# Patient Record
Sex: Female | Born: 1945
Health system: Southern US, Community
[De-identification: ages and names within clinical notes are randomized; demographics above are authoritative.]

## PROBLEM LIST (undated history)

## (undated) DIAGNOSIS — Z853 Personal history of malignant neoplasm of breast: Secondary | ICD-10-CM

## (undated) DIAGNOSIS — R7303 Prediabetes: Secondary | ICD-10-CM

## (undated) HISTORY — DX: Prediabetes: R73.03

## (undated) HISTORY — PX: BREAST SURGERY: SHX581

## (undated) HISTORY — DX: Personal history of malignant neoplasm of breast: Z85.3

---

## 1999-07-08 ENCOUNTER — Encounter: Admission: RE | Admit: 1999-07-08 | Discharge: 1999-07-08 | Payer: Self-pay | Admitting: Family Medicine

## 1999-07-08 ENCOUNTER — Encounter: Payer: Self-pay | Admitting: Family Medicine

## 2000-07-19 ENCOUNTER — Encounter: Admission: RE | Admit: 2000-07-19 | Discharge: 2000-07-19 | Payer: Self-pay | Admitting: Family Medicine

## 2000-07-19 ENCOUNTER — Encounter: Payer: Self-pay | Admitting: Family Medicine

## 2000-08-10 ENCOUNTER — Encounter: Admission: RE | Admit: 2000-08-10 | Discharge: 2000-08-10 | Payer: Self-pay | Admitting: Family Medicine

## 2000-08-10 ENCOUNTER — Encounter (INDEPENDENT_AMBULATORY_CARE_PROVIDER_SITE_OTHER): Payer: Self-pay

## 2000-08-10 ENCOUNTER — Encounter: Payer: Self-pay | Admitting: Family Medicine

## 2000-08-10 ENCOUNTER — Other Ambulatory Visit: Admission: RE | Admit: 2000-08-10 | Discharge: 2000-08-10 | Payer: Self-pay | Admitting: Family Medicine

## 2000-08-30 ENCOUNTER — Encounter: Payer: Self-pay | Admitting: General Surgery

## 2000-08-30 ENCOUNTER — Encounter: Admission: RE | Admit: 2000-08-30 | Discharge: 2000-08-30 | Payer: Self-pay | Admitting: General Surgery

## 2000-09-11 ENCOUNTER — Ambulatory Visit (HOSPITAL_BASED_OUTPATIENT_CLINIC_OR_DEPARTMENT_OTHER): Admission: RE | Admit: 2000-09-11 | Discharge: 2000-09-12 | Payer: Self-pay | Admitting: General Surgery

## 2000-09-11 ENCOUNTER — Encounter (INDEPENDENT_AMBULATORY_CARE_PROVIDER_SITE_OTHER): Payer: Self-pay | Admitting: *Deleted

## 2000-09-11 ENCOUNTER — Encounter: Payer: Self-pay | Admitting: General Surgery

## 2000-09-20 ENCOUNTER — Encounter: Admission: RE | Admit: 2000-09-20 | Discharge: 2000-12-19 | Payer: Self-pay | Admitting: Radiation Oncology

## 2000-10-06 ENCOUNTER — Encounter: Payer: Self-pay | Admitting: Oncology

## 2000-10-06 ENCOUNTER — Ambulatory Visit (HOSPITAL_COMMUNITY): Admission: RE | Admit: 2000-10-06 | Discharge: 2000-10-06 | Payer: Self-pay | Admitting: Oncology

## 2000-10-09 ENCOUNTER — Ambulatory Visit (HOSPITAL_COMMUNITY): Admission: RE | Admit: 2000-10-09 | Discharge: 2000-10-09 | Payer: Self-pay | Admitting: Oncology

## 2000-10-09 ENCOUNTER — Encounter: Payer: Self-pay | Admitting: Oncology

## 2000-12-30 ENCOUNTER — Encounter: Admission: RE | Admit: 2000-12-30 | Discharge: 2001-03-30 | Payer: Self-pay | Admitting: Radiation Oncology

## 2001-01-11 ENCOUNTER — Encounter: Admission: RE | Admit: 2001-01-11 | Discharge: 2001-01-11 | Payer: Self-pay | Admitting: Radiation Oncology

## 2001-07-23 ENCOUNTER — Encounter: Payer: Self-pay | Admitting: General Surgery

## 2001-07-23 ENCOUNTER — Encounter: Admission: RE | Admit: 2001-07-23 | Discharge: 2001-07-23 | Payer: Self-pay | Admitting: General Surgery

## 2001-10-16 ENCOUNTER — Encounter: Payer: Self-pay | Admitting: Oncology

## 2001-10-16 ENCOUNTER — Encounter: Admission: RE | Admit: 2001-10-16 | Discharge: 2001-10-16 | Payer: Self-pay | Admitting: Oncology

## 2002-07-24 ENCOUNTER — Encounter: Admission: RE | Admit: 2002-07-24 | Discharge: 2002-07-24 | Payer: Self-pay | Admitting: Oncology

## 2002-07-24 ENCOUNTER — Encounter: Payer: Self-pay | Admitting: Oncology

## 2003-02-20 ENCOUNTER — Encounter: Admission: RE | Admit: 2003-02-20 | Discharge: 2003-02-20 | Payer: Self-pay | Admitting: Family Medicine

## 2003-02-20 ENCOUNTER — Encounter: Payer: Self-pay | Admitting: Family Medicine

## 2003-02-21 ENCOUNTER — Encounter: Payer: Self-pay | Admitting: Family Medicine

## 2003-02-21 ENCOUNTER — Encounter: Admission: RE | Admit: 2003-02-21 | Discharge: 2003-02-21 | Payer: Self-pay | Admitting: Family Medicine

## 2003-03-04 ENCOUNTER — Ambulatory Visit: Admission: RE | Admit: 2003-03-04 | Discharge: 2003-03-04 | Payer: Self-pay | Admitting: Gynecology

## 2003-03-07 ENCOUNTER — Encounter: Payer: Self-pay | Admitting: Gynecology

## 2003-03-11 ENCOUNTER — Encounter (INDEPENDENT_AMBULATORY_CARE_PROVIDER_SITE_OTHER): Payer: Self-pay

## 2003-03-11 ENCOUNTER — Inpatient Hospital Stay (HOSPITAL_COMMUNITY): Admission: RE | Admit: 2003-03-11 | Discharge: 2003-03-13 | Payer: Self-pay | Admitting: Gynecology

## 2003-03-19 ENCOUNTER — Ambulatory Visit: Admission: RE | Admit: 2003-03-19 | Discharge: 2003-03-19 | Payer: Self-pay | Admitting: Gynecology

## 2003-08-04 ENCOUNTER — Encounter: Admission: RE | Admit: 2003-08-04 | Discharge: 2003-08-04 | Payer: Self-pay | Admitting: Oncology

## 2004-09-16 ENCOUNTER — Encounter: Admission: RE | Admit: 2004-09-16 | Discharge: 2004-09-16 | Payer: Self-pay | Admitting: Oncology

## 2004-11-16 ENCOUNTER — Ambulatory Visit: Payer: Self-pay | Admitting: Oncology

## 2005-05-20 ENCOUNTER — Ambulatory Visit: Payer: Self-pay | Admitting: Oncology

## 2005-09-19 ENCOUNTER — Encounter: Admission: RE | Admit: 2005-09-19 | Discharge: 2005-09-19 | Payer: Self-pay | Admitting: Oncology

## 2006-02-02 ENCOUNTER — Ambulatory Visit: Payer: Self-pay | Admitting: Oncology

## 2006-02-07 LAB — CBC WITH DIFFERENTIAL/PLATELET
BASO%: 0.5 % (ref 0.0–2.0)
HCT: 43.4 % (ref 34.8–46.6)
LYMPH%: 35.1 % (ref 14.0–48.0)
MCH: 30.3 pg (ref 26.0–34.0)
MCHC: 34.6 g/dL (ref 32.0–36.0)
MONO#: 0.3 10*3/uL (ref 0.1–0.9)
NEUT%: 53.6 % (ref 39.6–76.8)
Platelets: 235 10*3/uL (ref 145–400)
WBC: 4.1 10*3/uL (ref 3.9–10.0)

## 2006-02-07 LAB — COMPREHENSIVE METABOLIC PANEL
ALT: 10 U/L (ref 0–40)
CO2: 27 mEq/L (ref 19–32)
Creatinine, Ser: 0.78 mg/dL (ref 0.40–1.20)
Total Bilirubin: 0.4 mg/dL (ref 0.3–1.2)

## 2006-02-07 LAB — CANCER ANTIGEN 27.29: CA 27.29: <4 U/mL (ref 0–39)

## 2006-12-04 ENCOUNTER — Other Ambulatory Visit: Admission: RE | Admit: 2006-12-04 | Discharge: 2006-12-04 | Payer: Self-pay | Admitting: Family Medicine

## 2006-12-25 ENCOUNTER — Encounter: Admission: RE | Admit: 2006-12-25 | Discharge: 2006-12-25 | Payer: Self-pay | Admitting: Family Medicine

## 2007-10-24 ENCOUNTER — Emergency Department (HOSPITAL_COMMUNITY): Admission: EM | Admit: 2007-10-24 | Discharge: 2007-10-24 | Payer: Self-pay | Admitting: Emergency Medicine

## 2007-10-27 ENCOUNTER — Emergency Department (HOSPITAL_COMMUNITY): Admission: EM | Admit: 2007-10-27 | Discharge: 2007-10-27 | Payer: Self-pay | Admitting: Emergency Medicine

## 2008-02-04 ENCOUNTER — Inpatient Hospital Stay (HOSPITAL_COMMUNITY): Admission: RE | Admit: 2008-02-04 | Discharge: 2008-02-07 | Payer: Self-pay | Admitting: *Deleted

## 2008-02-04 ENCOUNTER — Ambulatory Visit: Payer: Self-pay | Admitting: *Deleted

## 2008-02-04 ENCOUNTER — Emergency Department (HOSPITAL_COMMUNITY): Admission: EM | Admit: 2008-02-04 | Discharge: 2008-02-04 | Payer: Self-pay | Admitting: Emergency Medicine

## 2009-08-04 ENCOUNTER — Other Ambulatory Visit: Admission: RE | Admit: 2009-08-04 | Discharge: 2009-08-04 | Payer: Self-pay | Admitting: Family Medicine

## 2009-08-10 ENCOUNTER — Encounter: Admission: RE | Admit: 2009-08-10 | Discharge: 2009-08-10 | Payer: Self-pay | Admitting: Family Medicine

## 2011-01-11 NOTE — H&P (Signed)
NAMEDARCIA, LAMPI NO.:  [id number]   MEDICAL RECORD NO.:  [id number]          PATIENT TYPE:  IPS   LOCATION:  0506                          FACILITY:  [facility]   PHYSICIAN:  Geoffery Lyons, M.D.      DATE OF BIRTH:  Jun 22, 1946   DATE OF ADMISSION:  02/04/2008  DATE OF DISCHARGE:                       PSYCHIATRIC ADMISSION ASSESSMENT   This is a voluntary admission to the services of Dr. Dub Mikes.   IDENTIFYING INFORMATION:  This is a 65 year old divorced white female.  Apparently she overdosed on an old prescription of Ambien because she  could not sleep and does not remember how much she took.  She then  stated, I may have taken 15.  There is a total of 20 missing from the  bottle.  She states that she was not sure if she wanted to kill herself  but decided that this morning she wanted to sleep more and escape.  The patient's employment called the patient's daughter because she had  not shown up for work.  When the daughter checked on her mother, the  patient was groggy and confused.  The patient states that several months  ago, she sought medication from Dr. Donell Beers.  She states that in the  last 2-3 years, she has had suicidal ideation on and off.  As it had  increased, she saw Dr. Donell Beers.  She started on Effexor, and then she  started developing paresthesias.  In fact, she was seen in the emergency  department at [facility] on February 28 for this, and she is under the care of  a neurologist, Dr. Thad Ranger.  So far, her workup is negative.  Specifically, her MRI of her head was negative.  There is no indication  at all that she has MS.   Since the paresthesias continued despite stopping the Effexor, and her  depression was increasing, she went back to Dr. Donell Beers, who cleared her  to restart the Effexor, and then she went and saw Dr. Thad Ranger, and they  bought agreed that she could restart the Effexor as well.  She has only  been back on the Effexor for approximately a  week.  She states that  since 2002, she has had many, many psychosocial stressors.  She has not  developed support or friends here like she has in other places.  She  feels that this is what happened, plus her frustration over a lack of a  diagnosis over her paresthesias.   PAST PSYCHIATRIC HISTORY:  She has never been an inpatient.  She has  started since the first of the year on an outpatient basis with Dr.  Donell Beers.  She is seeing a therapist, Hattie Perch.   SOCIAL HISTORY:  She has her Bachelor's in Social Work.  She has been  married and divorced twice.  She has a son age 104, a daughter 62.  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   FAMILY HISTORY:  Apparently, her mother suffered a  nervous breakdown  after her husband left when the patient was a child, and her maternal  grandfather suicided.  He had one leg partially amputated and was facing  the amputation of the other leg, at which point, he killed himself.   ALCOHOL/DRUG HISTORY:  She denies.   PRIMARY CARE Joya Willmott:  Wadie Lessen, Lupe Carney.   MEDICAL PROBLEMS:  She is status post left breast cancer.  She was  treated with lumpectomy, chemo and rad/rec seven years ago.   MEDICATIONS:  She is currently prescribed Effexor 75 mg p.o. daily and  Ambien 10 mg nightly.   DRUG ALLERGIES:  She has no known drug allergies.   PHYSICAL EXAMINATION:  She was medically cleared in the ED at [facility].  Vital signs on admission to the unit show that she is 5 feet 9, weighs  172.  Temperature 96.9, blood pressure 126/76, pulse 77-87, respirations  are 16.  She had a hysterectomy five years ago.   Her labs show no abnormalities of CBC.  Her UA was negative.  Her UDS,  interestingly enough, was negative for benzodiazepines.  She had no  other worrisome lab abnormalities.   MENTAL STATUS EXAM:  Tonight she is  alert and oriented.  She is  appropriately groomed, dressed, and nourished.  She is walking unaided.  Her motor exam is normal.  Her speech is normal rate, rhythm, and tone.  Her mood is appropriate to the situation.  Her affect has a normal  range.  Her thought processes are somewhat clear, rational, and goal-  oriented.  Judgment and insight are fair.  Concentration and memory are  intact.  Intelligence is at least average.  She denies being suicidal or  homicidal.  She denies any auditory or visual hallucinations.  She  states that she does feel depressed.  She has no energy.  If she uses  the Ambien, she gets six hours of sleep.  Her appetite has been somewhat  decreased the past 3-4 weeks, but her weight is steady.  She has only  had a 2-3 pound weight difference.   DIAGNOSES:   AXIS I:  Major depressive disorder, severe, without psychotic features.   AXIS II:  Deferred.   AXIS III:  Status post left breast carcinoma seven years ago.  Recently,  she has had paresthesias, for which no diagnosis has been able to be  made yet.   AXIS IV:  Severe, situational stressors.   AXIS V:  30.   PLAN:  Admit for safety and stabilization. We will adjust her meds as  indicated.  She stated that Dr. Thad Ranger' office called earlier today  and needs for her to get a neck and back MRI.  She was told that the  case manager would call Dr. Thad Ranger office about this, and apparently  he also suggested some Neurontin for her paresthesias and will just have  to call him and get his indications, what his plan is.   ESTIMATED LENGTH OF STAY:  4-5 days.     Mickie Leonarda Salon, P.A.-C.      Geoffery Lyons, M.D.  Electronically Signed   MD/MEDQ  D:  02/05/2008  T:  02/05/2008  Job:  [id number]

## 2011-01-14 NOTE — Discharge Summary (Signed)
   NAME:  Gina Jacobs, Gina Jacobs                         ACCOUNT NO.:  [id number]   MEDICAL RECORD NO.:  [id number]                   PATIENT TYPE:  INP   LOCATION:  0445                                 FACILITY:  [facility]   PHYSICIAN:  Rudy Jew. Ashley Royalty, M.D.             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  Permanent pathology pending.   OPERATIONS AND SPECIAL PROCEDURES:  Total abdominal hysterectomy, bilateral  salpingo-oophorectomy, appendectomy.   CONSULTATIONS:  None.   DISCHARGE MEDICATIONS:  Percocet.   HISTORY AND PHYSICAL:  This is a 65 year old white female seen recently by  me as well as Dr. De Blanch for a pelvic abdominal mass. She is  admitted for management of the same. For the remainder of the history and  physical please see the chart.   HOSPITAL COURSE:  The patient was admitted to [facility]  Preadmission laboratory studies were drawn. On March 11, 2003, she was taken  to the operating room and underwent total abdominal hysterectomy/bilateral  salpingo-oophorectomy. Frozen section revealed mucinous cystadenoma of the  left ovary. The procedure was uncomplicated. The patient's postoperative  course was completely benign. On the second postoperative day, she had  experienced a bowel movement. She stated a desire to return home.  Approximately one-half of the staples were removed and Benzoin and Steri-  Strips applied. The patient was felt stable for discharge and was discharged  home in satisfactory condition.   ACCESSORY CLINICAL FINDINGS:  Hemoglobin and hematocrit on admission were  14.5 and 42, respectively. White blood count was 3500. Repeat values were  obtained March 12, 2003, for 11.7, 33.6, and 5900 respectively. Type and Rh  revealed A-negative blood.  Chest x-ray revealed minimal atelectatic change  at the left base.   DISPOSITION:  The patient was to return to [facility]  Obstetric[facility] on March 17, 2003, for completion of clip removal. She is also to  return in four to six weeks for postoperative assessment. Full discharge  instructions were given.                                               James A. Ashley Royalty, M.D.    JAM/MEDQ  D:  03/13/2003  T:  03/14/2003  Job:  [id number]

## 2011-01-14 NOTE — Consult Note (Signed)
   NAME:  Gina Jacobs, Gina Jacobs NO.:  [id number]   MEDICAL RECORD NO.:  [id number]                   PATIENT TYPE:  OUT   LOCATION:  GYN                                  FACILITY:  [facility]   PHYSICIAN:  De Blanch, M.D.         DATE OF BIRTH:  02/22/1946   DATE OF CONSULTATION:  03/19/2003  DATE OF DISCHARGE:                                   CONSULTATION   REASON FOR CONSULTATION:  A 65 year old white female returns having  undergone a total abdominal hysterectomy, bilateral salpingo-oophorectomy,  and appendectomy for a left ovarian tumor on March 11, 2003.  She had an  uncomplicated postoperative course and reports that she is doing quite  nicely at the present time.   Final pathology showed an ovarian mucinous cyst adenoma with focal areas of  borderline change.  Uterus, right tube and ovary, and appendix were normal.  Because of intraoperative report that the patient had a benign mucinous  tumor, peritoneal cytology was discarded.  There was no gross evidence of  any metastatic disease at the time of exploration.   I had a lengthy discussion with the patient and her family regarding the  natural history of borderline tumors.  Given that this is confined in ovary,  I do not believe any additional therapy would be of benefit, and the  patient's prognosis should be very good.  We do recommend that she be  followed by Dr. Ashley Royalty with annual exams.  It is noted that her CA-125  value preoperatively was normal, therefore, I do not believe there would be  any benefit in obtaining followup CA-125s.  All of the patient's questions  are answered, and she will return to Dr. Ashley Royalty for a six-week follow-up  visit and annual exams.                                               De Blanch, M.D.    DC/MEDQ  D:  03/19/2003  T:  03/19/2003  Job:  [id number]   cc:   Fayrene Fearing A. Ashley Royalty, M.D.  [address]  [place], [place] [address]  Fax: [phone]   Telford Nab, R.N.  [address]  [place], [place] [address]

## 2011-01-14 NOTE — Discharge Summary (Signed)
NAMESHERRIN, STAHLE NO.:  [id number]   MEDICAL RECORD NO.:  [id number]          PATIENT TYPE:  IPS   LOCATION:  0504                          [facility]   PHYSICIAN:  Geoffery Lyons, M.D.      DATE OF BIRTH:  1946-02-15   DATE OF ADMISSION:  02/04/2008  DATE OF DISCHARGE:  02/07/2008                               DISCHARGE SUMMARY   CHIEF COMPLAINT/PRESENTING ILLNESS:  This was the first admission to  [facility] for this 65 year old divorced white female.  She overdosed on an old prescription of Ambien.  She could not sleep.  Could not remember how much she took. She then stated she says they may  have taken 15.  There were a total of 20, missing from the bottle. She  was not sure if she wanted to kill herself, but decided that she wanted  to sleep more and escape.  The employer called the patient's daughter  because she had not shown up for work, when the daughter checked her  mother, she was groggy and confused. Several months prior to this  admission she sought medications from Dr. Donell Beers.  In the last two or  three years, she had had suicidal ideas on and off. She was started on  Effexor and started developing paresthesias. She was seen in the ED at  [facility].  She is under the care of Dr. Thad Ranger, a neurologist.  The  paresthesias continued despite stopping the Effexor and her depression  was increasing. She went back to Dr. Donell Beers.  She went back on the  Effexor. Endorsed multiple psychosocial stressors. Has not developed a  support system. Endorsed she had been frustrated with the lack of a  diagnoses over her paresthesia.   PAST PSYCHIATRIC HISTORY:  Never inpatient. She has seen Dr. Donell Beers and  sees a therapist Hattie Perch.   SECONDARY HISTORY:  No active use of any substances.   MEDICAL HISTORY:  Status post left breast cancer.   MEDICATION:  1. Effexor XR 75 mg per day.  2. Ambien 10 at bedtime.   PHYSICAL EXAMINATION:  Exam  failed to show any acute findings.   LABORATORY WORK:  CBC within normal limits.  UDS negative for substances  of abuse said that this time reveals alert cooperative female  appropriately groomed, dressed and nourished. Speech was normal in rate,  tempo and production.  Mood was anxious.  Affect was broad.  Thought  processes were logical, coherent and relevant.  There were no active  suicidal ideations.  No evidence of delusions.  No hallucinations.  Cognition well-preserved.   ADMITTING DIAGNOSES:  AXIS I: Major depressive disorder.  AXIS II: No diagnosis.  AXIS III:  Status post left breast cancer, paresthesia.  AXIS IV: Moderate.  AXIS V:  Upon admission 30, 35, highest GAF in the last year 65.   COURSE IN THE HOSPITAL:  She was admitted, started individual and group  psychotherapy and we maintained the Effexor.  She was placed back on the  Ambien. As already stated, 65 year old female, divorced, with two  children  who works in the Animal nutritionist. Used to work at [employer]. Left due to issues with another staff member. She left and  start working in the [employer] and has been there for the last 4  years.  Start experiencing increased pressures and demands from work.  She has been battling the depression for the last several years. Got to  a point she could not function anymore.  She was started on Effexor.  She had been using it successfully in the past.  She is still having the  paresthesia, some burning sensation. It was discontinued (Effexor was  discontinued),  but since the symptoms continue and her depression got  worse, she went back on the Effexor. She sees Drusilla Kanner  and had been successfully using Effexor in the past. She also endorsed  other than having the breast cancer, mother having a stroke and being  involved in a failed relationship.  There was a family session with her  daughter that went well.  She was very concerned about  communicating  with the daughter. Felt very encouraged and very supported. June 11, she  was in full contact with reality.  There was some marked change from  admission.  Her mood was better and her affect was bright.  Endorsed  that she learned a lot being on the other side as she worked in the  Animal nutritionist.  Did work on self on her coping.  She was able to  get to feel back to the way she was feeling before she started dealing  with this depression. Encouraged, motivated, no homicidal or suicidal  ideation.  We went ahead and discharged to outpatient follow-up.   DISCHARGE DIAGNOSES:  AXIS I: Major depressive disorder.  AXIS II: No diagnosis.  AXIS III:  No diagnosis.  AXIS IV: Moderate.  AXIS V:  Upon discharge 60.   Discharged on Effexor XR 35 mg per day, Ambien CR 12.5 and at bedtime as  needed for sleep.  Follow-up Dr. Donell Beers and Belva Bertin, M.D.  Electronically Signed     IL/MEDQ  D:  03/05/2008  T:  03/05/2008  Job:  [id number]

## 2011-01-14 NOTE — Consult Note (Signed)
NAME:  Gina Jacobs, Gina Jacobs NO.:  [id number]   MEDICAL RECORD NO.:  [id number]                   PATIENT TYPE:  OUT   LOCATION:  GYN                                  FACILITY:  [facility]   PHYSICIAN:  De Blanch, M.D.         DATE OF BIRTH:  Aug 08, 1946   DATE OF CONSULTATION:  03/04/2003  DATE OF DISCHARGE:                                   CONSULTATION   HISTORY OF PRESENT ILLNESS:  A 65 year old white female seen in consultation  at the request of Ronda Fairly. Tuso, M.D. regarding a newly diagnosed  abdominal mass.   The patient notes that approximately two months ago her abdominal contour  became enlarged.  She denies any pain or pressure, any other GI or GU  symptoms.  She was seen by Ronda Fairly. Tuso, M.D. who discovered a 20 cm  abdominopelvic mass.  Evaluation to date has included a CT scan and a CA-  125.  The CA-125 was 23.  CT scan shows a multiloculated pelvic cyst arising  from the midline extending into the lower abdomen.  There is a slight amount  of hydronephrosis, but no other adenopathy, ascites, or any evidence of  metastatic disease.   PAST MEDICAL HISTORY:  Breast cancer December 2001.  The patient was treated  with lumpectomy, lymph node dissection, and chemotherapy followed by  radiation therapy.  The patient has been followed subsequently by Raymond Gurney C.  Magrinat, M.D. and remains free of disease.  She has no other medical  illnesses.   PAST SURGICAL HISTORY:  1. Breast surgery noted above.  2. Bilateral tubal ligation.   ALLERGIES:  None.   CURRENT MEDICATIONS:  Arimidex.   PAST OBSTETRICAL HISTORY:  Gravida 2.   PAST GYNECOLOGICAL HISTORY:  The patient is menopausal since approximately  age 27.  She took hormonal replacement therapy briefly.  She has no other  gynecologic problems in the past.   FAMILY HISTORY:  Negative for gynecologic, breast, or colon cancer.   SOCIAL HISTORY:  The patient is divorced.  She  does not smoke.  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   REVIEW OF SYSTEMS:  No cardiovascular, pulmonary, GI, GU, musculoskeletal,  or neurologic symptoms.   PHYSICAL EXAMINATION:  VITAL SIGNS:  Weight 162 pounds.  GENERAL:  The patient is a healthy white female in no acute distress.  HEENT:  Negative.  NECK:  Supple without thyromegaly.  LYMPH:  There is no supraclavicular or inguinal adenopathy.  ABDOMEN:  Soft, nontender.  She has a mass extending nearly to the  umbilicus.  This is nontender to palpation.  There is no evidence of ascites  or hernias.  PELVIC:  EGBUS, vagina, bladder, urethra are normal.  Cervix is parous and  normal.  Uterus is difficult to outline given the fact this large mass is  filling the entire pelvic.  Rectovaginal examination confirms.  LABORATORIES:  The patient's CT scan and laboratory data are reviewed.   IMPRESSION:  Multiloculated ovarian cyst which is quite large, most likely a  mucinous cystadenoma.   PLAN:  I would recommend the patient undergo exploratory laparotomy and  resection of the mass.  The patient desires to have uterus and other tube  and ovary removed.   We did discuss at length if this did turn out to be a cancer the extent of  surgical staging including omentectomy, peritoneal biopsies, and pelvic and  periaortic lymphadenectomy.   The patient's questions are answered.  She is aware of the risks of surgery  including hemorrhage, infection, injury to adjacent viscera, thromboembolic  complications, and anesthetic risks.  She wishes to proceed with surgery  next Tuesday.  She understands Ronda Fairly. Galen Daft, M.D. will be out of his  office for the next two weeks and that Fayrene Fearing A. Ashley Royalty, M.D. and I will be  performing her surgery.                                               De Blanch, M.D.    DC/MEDQ  D:  03/04/2003  T:  03/04/2003  Job:  [id number]   cc:   Ronda Fairly. Galen Daft, M.D.   [address], [address]  [place]  [place] [address]  Fax: [phone]   Telford Nab, R.N.  [phone]  [place], [place] [address]

## 2011-01-14 NOTE — H&P (Signed)
NAME:  Gina Jacobs, Gina Jacobs                         ACCOUNT NO.:  [id number]   MEDICAL RECORD NO.:  [id number]                   PATIENT TYPE:  INP   LOCATION:  NA                                   FACILITY:  [facility]   PHYSICIAN:  James A. Ashley Royalty, M.D.             DATE OF BIRTH:  13-Apr-1946   DATE OF ADMISSION:  03/11/2003  DATE OF DISCHARGE:                                HISTORY & PHYSICAL   HISTORY OF PRESENT ILLNESS:  The patient is a 65 year old G3, P2, AB1  referred through the courtesy of Mckinley Jewel, M.D. for evaluation and  management of a pelviabdominal mass.  The patient was originally seen in the  office of Dr. Lupe Carney by Alcide Clever.  Pelvic examination revealed  a large mass.  Pap was done in the office and reportedly was negative.  The  patient was then sent to Dr. Galen Daft who concurred regarding the presence of a  pelviabdominal mass.  Due to the fact that Dr. Galen Daft is unavailable for much  of the month he referred the patient to me in order to maintain continuity  of care and expeditious disposition of her problem. An ultrasound was  performed a [facility] February 20, 2003 which revealed a  complex mass occupying a large portion of the pelvis to the lower abdomen  that is suspicious for ovarian carcinoma.  In addition a CT scan was  performed February 21, 2003 at [facility] which revealed a 20  X 18 X 13 cm pelviabdominal mass with no associated peritoneal metastatic  disease, adenopathy or free pelvic fluid.  The differential diagnosis  included benign or malignant neoplasm.  The patient is hence for exploratory  laparotomy with Dr. De Blanch.   MEDICATIONS:  Arimidex, Ambien.   PAST MEDICAL HISTORY:  Medical history is negative.   PAST SURGICAL HISTORY:  Patient is status post left lumpectomy for breast  carcinoma.  She also had chemotherapy and radiation.   ALLERGIES:  None.   FAMILY HISTORY:  Positive for  diabetes.   SOCIAL HISTORY:  Patient denies use of tobacco or significant alcohol.   REVIEW OF SYMPTOMS:  Noncontributory.   PHYSICAL EXAMINATION:  GENERAL:  Well-developed, well-nourished, pleasant  white female in no acute distress, afebrile.  VITAL SIGNS:  Stable.  SKIN:  Warm and dry without lesions.  LYMPH:  There is no cervical, supraclavicular or inguinal lymphadenopathy.  HEENT:  Normocephalic.  NECK:  Supple without thyromegaly.  CHEST:  Lungs are clear.  CARDIAC:  Regular rate and rhythm without murmurs, gallops or rubs.  BREASTS: Bilateral without dominant mass, discharge, retraction or  adenopathy.  There is a well healed surgical scar on the superior aspect of  the left breast.  ABDOMEN:  Examination reveals an obvious mass which comes up to  approximately the level of the umbilicus. Bowel sounds are active.  MUSCULOSKELETAL:  Examination reveals full range  of motion without edema,  cyanosis or costovertebral angle tenderness.  PELVIC:  On examination the external genitalia is atrophic.  Urethra and  meatus are normal.  Vagina and cervix are without gross lesions.  Pap smear  is deferred.  Bimanual examination reveals large pelviabdominal mass.  The  superior aspect of the mass is noted to reach the umbilicus.   IMPRESSION:  1. Pelviabdominal mass, etiology uncertain.  Differential includes benign     versus malignant ovarian neoplasm.  2. History of breast carcinoma 2.5 years ago treated with lumpectomy,     chemotherapy and radiation therapy.   PLAN:  Exploratory laparotomy with total abdominal hysterectomy and  bilateral salpingo-oophorectomy and indicated procedures.  The patient  understands the possibility of carcinoma and in that circumstance that she  would be a candidate for pelvic and periaortic lymphadenectomy and agrees.  The risks, benefits, complications and alternatives were full discussed with  the patient.  She states she understands and accepts.   Questions are invited  and answered.                                               James A. Ashley Royalty, M.D.    JAM/MEDQ  D:  03/10/2003  T:  03/10/2003  Job:  [id number]

## 2011-01-14 NOTE — Consult Note (Signed)
Gina Jacobs, Gina Jacobs NO.:  [id number]   MEDICAL RECORD NO.:  [id number]          PATIENT TYPE:  EMS   LOCATION:  ED                           FACILITY:  [facility]   PHYSICIAN:  Darnelle Bos, MDDATE OF BIRTH:  [birth year]   DATE OF CONSULTATION:  DATE OF DISCHARGE:  10/27/2007                                 CONSULTATION   REQUESTING PHYSICIAN:  [facility].   REASON FOR CONSULTATION:  Paresthesias.   Ms. Reach is a 65 year old, right handed, Caucasian female who has been  having episodes of paresthesias for a week.  The patient describes these  episodes which first started on Saturday as both feet getting numb which  spread up to the knees and also hands up to the elbows and her face,  tongue, and scalp getting numb and tingling.  Eventually, it effected  her breathing which became shallow.  This episode lasted for a few  hours.  It seems she had another episode which appeared worse on  Wednesday and another episode on Saturday morning.  She also thinks that  during the these episodes, she has to think before she talks and needs  to talk slowly and walk slowly due to the numbness.  She denies any headache.  She denies any dizziness.  She denies any  weakness per se.  She denies any bowel or bladder problems.  She denies  any chest pain or palpitations.  She does think that after the episode  starts she starts breathing harder and hyperventilates.  She thinks that  the episodes have slowly been intensifying.  She was recently started on Effexor and Ambien on February 13th for  major depression.  Initially it was thought that they were panic  attacks.  She spoke to her psychiatrist and they do not think that these  episodes are suggestive of panic attacks.  She also reports some recent  sinus infection.   PAST MEDICAL HISTORY:  1. Major depression.  2. History of breast cancer, status post lumpectomy with chemo and      radiation 7 years  ago.   SOCIAL HISTORY:  The patient denies any smoking or alcohol use.  She  does not work.   MEDICATIONS:  Effexor and Ambien CR.   ALLERGIES:  None.   REVIEW OF SYSTEMS:  All systems are reviewed and everything was negative  other than that mentioned in the history and physical.  She has not  noticed any change in her appetite or weight.   PHYSICAL EXAMINATION:  VITAL SIGNS:  Heart rate 66, blood pressure  138/70, respirations 20, temperature 97.4.  GENERAL:  Ms. Patchen is a pleasant female in no acute distress.  HEENT:  Normocephalic atraumatic.  NECK:  Supple.  No carotid bruits.  CARDIOVASCULAR:  Regular rhythm and rate.  LUNGS:  Clear  EXTREMITIES:  No cyanosis, clubbing, or edema.  NEUROLOGIC:  The patient is awake, alert, oriented.  Her mental function  was within normal limits.  Cranial nerves were intact.  Pupils were  symmetric and reactive to light.  No facial asymmetry.  Tongue was  midline without any weakness.  Palate elevation was symmetric.  Facial  sensation was normal to pinprick, touch, and temperature.  Motor  evaluation normal bulk and  tone.  Deep tendon reflexes were 2 plus and  symmetric in the upper extremities, 1 plus and symmetric in the lower  extremities.  Plantars were downgoing.  Sensation was intact to pinprick  other than some areas of decreased pinprick on the right leg and the arm  that do not qualify to be in any dermatomal distribution and was patchy.  She also has decreased temperature on the left side which was patchy  without any dermatomal distribution.  Normal vibration.  Normal position  .  Normal finger-to-nose testing and knee heel testing.  Normal truncal  balance.   She had a D-dimer today that was 5.53.  BMP:  Sodium 141, potassium 4,  chloride 104, bicarb 31, glucose 100, BUN 11, calcium 9.2.  Cardiac  markers showed a CK-MB of less than 1, troponin of less than 0.05,  myoglobin 44.9.  CBC showed hemoglobin of 15.7, hematocrit  44.8, WBC  8.5, platelets 265.  She had a CT head today that showed no acute  intracranial findings and stable when compared to prior exam.   IMPRESSION:  Bilateral arm and leg paresthesias which are transient.  This is unlikely to be a vascular event but she does note that she might  have some problem on the left more than the right side.  Since the  patient's exam has had these recurrent symptoms and some of them have  been associated with respiratory alkalosis, it is unclear if this may be  secondary to respiratory alkalosis.   Due to her risk factors of hypertension, we do recommend an outpatient  MRI of the brain with MRA and followup with a neurologist.  We will also consider stopping the Effexor as sometimes that may cause  some paresthesias.  Her exam is not consistent with an AIDP  or Guillain-Barre syndrome as she does have intact reflexes and her  examination is normal.   The patient was advised to seek immediate medical attention if she has  another episode of worsening of her symptoms.  The patient expresses  satisfaction and appears not to have any questions at this time.      Darnelle Bos, MD  Electronically Signed     RB/MEDQ  D:  10/28/2007  T:  10/28/2007  Job:  [id number]

## 2011-01-14 NOTE — Op Note (Signed)
NAME:  Gina Jacobs, Gina Jacobs                         ACCOUNT NO.:  [id number]   MEDICAL RECORD NO.:  [id number]                   PATIENT TYPE:  INP   LOCATION:  Y782                                 [facility]   PHYSICIAN:  De Blanch, M.D.         DATE OF BIRTH:  [birth year]   DATE OF PROCEDURE:  03/11/2003  DATE OF DISCHARGE:                                 OPERATIVE REPORT   PREOPERATIVE DIAGNOSIS:  Complex pelvic mass.   POSTOPERATIVE DIAGNOSIS:  Mucinous cystadenoma of the left ovary.   PROCEDURE:  Total abdominal hysterectomy, bilateral salpingo-oophorectomy  and appendectomy.   CO-SURGEONS:  De Blanch, M.D. and Rudy Jew. Ashley Royalty, M.D.   ASSISTANT:  Telford Nab, R.N.   ANESTHESIA:  General with oral tracheal tube.   ESTIMATED BLOOD LOSS:  50 mL   SURGICAL FINDINGS:  At the time of exploratory laparotomy, the upper abdomen  including the diaphragm, liver, spleen, stomach, omentum, small and large  bowel were normal. There is an 18 cm multicystic mass arising from the left  ovary which was smooth, had no excrescences and was not adherent to any  other structures. The frozen section returned showing a mucinous  cystadenoma. Because of the mucinous component, an appendectomy was  indicated. The right tube and ovary and uterus appeared normal.   DESCRIPTION OF PROCEDURE:  The patient was brought to the operating room and  after satisfactory attainment of general anesthesia was placed in the  modified lithotomy position in [place] stirrups. The anterior abdominal wall,  perineum and vagina were prepped with Betadine, Foley catheter was placed,  and the patient was draped. The abdomen was entered through a left  paramedian incision. Peritoneal washings were obtained from the pelvis. The  upper abdomen and pelvis were explored with the above noted findings. The  left retroperitoneal space was opened identifying the ureter. The ovarian  vessels were  skeletonized and clamped. At this juncture, the patient  developed a severe bradycardia necessitating application of atropine. The  procedure was halted at this juncture until her heart rate returned to  normal. Thereafter the ovarian vessels were divided, free tied and suture  ligated. The broad ligament was incised. The uterine ovarian anastomosis and  fallopian tube were cross clamped and divided and the left tube and ovary  handed off the operative field for frozen section. The uterine cornu were  grasped with long Kelly clamps. A Bookwalter retractor was assembled and the  small bowel and colon were packed out of the pelvis. The right round  ligament was divided, the retroperitoneal space opened, the ovarian vessels  skeletonized, clamped, cut, free tied and suture ligated. The bladder flap  was advanced with sharp and blunt dissection, the uterine vessels were  skeletonized and then clamped, cut and suture ligated in a step wise  fashion. The paracervical and cardinal ligaments were clamped, cut and  suture ligated. The rectovaginal septum was opened isolating the uterosacral  ligaments and vaginal angle. The vagina and uterosacral ligaments were cross  clamped, divided and the vagina transected from its junction with the  cervix. The uterus, cervix, right tube and ovary were handed off the  operative field. The vaginal angles were transfixed with #0 Vicryl, the  central portion of the vagina and rectovaginal septum closed with  interrupted figure-of-eight sutures of #0 Vicryl. The pelvis was inspected  and found to be hemostatic.   Because frozen section indicated this was a mucinous cystadenoma of the  ovary and because of the association with mucinous tumors of the appendix,  appendectomy was felt to be indicated. The appendiceal vessels were isolated  and the mesoappendix cross clamped including these vessels and divided. This  was suture ligated. The appendiceal base was then  cross clamped and the  appendix transected. The appendiceal stump was suture ligated with 2-0  Vicryl suture and tied before and after. A pursestring suture of 2-0 silk  was placed and the stump inverted and the pursestring suture tied. The  pelvis and the abdomen were irrigated with saline, all packs and retractors  were removed, the anterior abdominal wall was closed in layers the first  being a running mass closure using #1 PDS. The subcutaneous tissue was  irrigated, hemostasis achieved with cautery and the skin closed with skin  staples. A dressing was applied, the patient was awakened from anesthesia  and taken to the recovery room in satisfactory condition. Sponge, needle and  instrument counts were correct x2.                                               De Blanch, M.D.    DC/MEDQ  D:  03/11/2003  T:  03/11/2003  Job:  [id number]   cc:   Fayrene Fearing A. Ashley Royalty, M.D.  [address]  [place], [place] [address]  Fax: [phone]   Telford Nab, R.N.  [address]  [place], [place] [address]   L. Lupe Carney, M.D.  [address] [place]  [place] [address]  Fax: [phone]   Ronda Fairly. Galen Daft, M.D.  [address]  [place]  [place] [address]  Fax: [phone]

## 2011-01-14 NOTE — Op Note (Signed)
[place]  Patient:    ELIF, YONTS                      MRN: [id number] Proc. Date: 09/11/00 Adm. Date:  [id number] Attending:  Edmund Hilda CC:         [facility], M.D.   Operative Report  PREOPERATIVE DIAGNOSIS:  Carcinoma left breast.  POSTOPERATIVE DIAGNOSIS:  Carcinoma left breast.  PROCEDURE:  Sentinel lymph node biopsy x 2, partial mastectomy left.  SURGEON:  Timothy E. Earlene Plater, M.D.  ANESTHESIA:  General.  INDICATIONS FOR PROCEDURE:  Ms. Omura was referred to my office in middle December with a Core needle biopsy proven adenocarcinoma.  After careful consultation she wished to delay the surgery until after the holidays and then was delayed by snow, so this is the first urgent time available.  The patient is well informed and ready to proceed and agrees and understands the procedure.  PROCEDURE IN DETAIL:  She was brought to the operating room after having been at [facility] where technetium was injected beneath the left areolar complex.  She was placed supine on the operating table, general LAM anesthesia provided. Lymphazurin green dye was injected under the nipple areolar complex and into the tumor bed.  This was gently massaged.  The entire left breast and chest was prepped and draped in the usual fashion.  The tumor was palpable in the 12 oclock position extending from the 12 to the 2 oclock position approximately 8 cm outside the areolar edge.  The neoprobe was used to scan the axillary area and there appeared to be one area warmer than any other.  This was marked.  The natural folds of the axillae were marked, and then a small incision was made over the estimated hot nodal area.  Surgery was continued down until the axillary fat was entered and then careful scanning with the neoprobe revealed a single area that was quite hot.  From this area and in about the lower mid axilla was an  obvious lymph node, it was grasped and bluntly dissected from the surrounding tissue.  It indeed was hot. It was sent at specimen #1 hot blue node.  Immediately behind that was a second hot area that after dissection revealed itself to be a lymph node as well.  It was blue and hot as well, labeled and sent as specimen #2.  Careful scanning of the rest of the axilla revealed no areas of increased activity.  The axilla was packed, draped separately, and then I proceeded with a generous partial mastectomy excising the skin, and then by palpation, sharp dissection, around the palpable tumor area removed the entire tumor and surrounding breast tissue.  This was marked and sent to pathology.  This cavity was carefully inspected.  Bleeding was controlled with a cautery.  The wound was dry.  I did measure the liguid volume of the cavity and it was approximately 50 cc.  That wound was closed with 3-0 Vicryl and 4-0 Monocryl.  The counts were correct at that point.  The two lymph nodes touch preps were returned as both negative by Dr. Sherryll Burger and the axilla was closed as well with 3-0 Vicryl and 4-0 Monacryl. Steri-Strips applied.  Second counts correct.  Dr. Sherryll Burger did proceed with a frozen section for margins of the partial mastectomy specimen and they too were negative.  This completed the procedure.  The patient was  awakened and taken to the recovery room in good condition.  She will be kept overnight in recovery care center and followed as an outpatient. DD:  09/11/00 TD:  09/11/00 Job: [id number]

## 2011-05-20 LAB — BASIC METABOLIC PANEL
BUN: 11
Chloride: 104
GFR calc non Af Amer: >60
Potassium: 4
Sodium: 141

## 2011-05-20 LAB — COMPREHENSIVE METABOLIC PANEL
ALT: 12
AST: 16
Albumin: 4
CO2: 18 — ABNORMAL LOW
Chloride: 106
GFR calc Af Amer: >60
GFR calc non Af Amer: >60
Potassium: 3.6
Sodium: 138
Total Bilirubin: 0.9

## 2011-05-20 LAB — CBC
HCT: 44.8
Hemoglobin: 15.7 — ABNORMAL HIGH
MCV: 87.9
Platelets: 265
RBC: 5.13 — ABNORMAL HIGH
WBC: 7.5
WBC: 8.5

## 2011-05-20 LAB — BLOOD GAS, ARTERIAL
Acid-Base Excess: 0.7
Drawn by: 145321
FIO2: 0.21
pCO2 arterial: 16.5 — CL
pH, Arterial: 7.654
pO2, Arterial: 99

## 2011-05-20 LAB — DIFFERENTIAL
Basophils Absolute: 0.1
Eosinophils Absolute: 0
Eosinophils Absolute: 0.1
Eosinophils Relative: 0
Eosinophils Relative: 1
Lymphocytes Relative: 28
Lymphs Abs: 2
Lymphs Abs: 2.4
Monocytes Absolute: 0.5
Monocytes Absolute: 0.6
Monocytes Relative: 7

## 2011-05-20 LAB — POCT CARDIAC MARKERS
CKMB, poc: <1 — ABNORMAL LOW
Troponin i, poc: <0.05
Troponin i, poc: <0.05

## 2011-05-20 LAB — D-DIMER, QUANTITATIVE
D-Dimer, Quant: 0.51 — ABNORMAL HIGH
D-Dimer, Quant: 0.53 — ABNORMAL HIGH

## 2011-05-26 LAB — CBC
Hemoglobin: 14.8
MCHC: 34.2
MCV: 87.4
RBC: 4.96
WBC: 4.5

## 2011-05-26 LAB — RAPID URINE DRUG SCREEN, HOSP PERFORMED
Amphetamines: NOT DETECTED
Barbiturates: NOT DETECTED
Benzodiazepines: NOT DETECTED
Cocaine: NOT DETECTED
Opiates: NOT DETECTED
Tetrahydrocannabinol: NOT DETECTED

## 2011-05-26 LAB — DIFFERENTIAL
Basophils Relative: 1
Lymphocytes Relative: 44
Monocytes Absolute: 0.3
Monocytes Relative: 8
Neutro Abs: 2.2
Neutrophils Relative %: 47

## 2011-05-26 LAB — URINALYSIS, ROUTINE W REFLEX MICROSCOPIC
Bilirubin Urine: NEGATIVE
Glucose, UA: NEGATIVE
Hgb urine dipstick: NEGATIVE
Ketones, ur: NEGATIVE
Nitrite: NEGATIVE
Specific Gravity, Urine: 1.007
pH: 7.5

## 2011-05-26 LAB — COMPREHENSIVE METABOLIC PANEL
Albumin: 3.4 — ABNORMAL LOW
BUN: 4 — ABNORMAL LOW
Calcium: 8.7
Creatinine, Ser: 0.8
Glucose, Bld: 88
Total Protein: 6.5

## 2011-11-29 ENCOUNTER — Other Ambulatory Visit: Payer: Self-pay | Admitting: Family Medicine

## 2011-11-29 DIAGNOSIS — Z1231 Encounter for screening mammogram for malignant neoplasm of breast: Secondary | ICD-10-CM

## 2011-12-05 ENCOUNTER — Ambulatory Visit
Admission: RE | Admit: 2011-12-05 | Discharge: 2011-12-05 | Disposition: A | Discharge status: Home / Self Care | Payer: Medicare Other | Source: Ambulatory Visit | Attending: Family Medicine | Admitting: Family Medicine

## 2011-12-05 DIAGNOSIS — Z1231 Encounter for screening mammogram for malignant neoplasm of breast: Secondary | ICD-10-CM

## 2015-05-07 ENCOUNTER — Telehealth (HOSPITAL_COMMUNITY): Payer: Self-pay | Admitting: Psychiatry

## 2015-05-07 NOTE — Telephone Encounter (Signed)
Fax request for refill of zolpidem sent to Dr Donell Beers at this site.  No record or her having been seen here so faxed back to pharmacy suggesting reaching Dr Donell Beers at another site.

## 2015-06-22 ENCOUNTER — Other Ambulatory Visit (HOSPITAL_COMMUNITY): Payer: Self-pay | Admitting: Psychiatry

## 2019-07-23 ENCOUNTER — Other Ambulatory Visit: Payer: Self-pay | Admitting: Family Medicine

## 2019-07-23 DIAGNOSIS — R0989 Other specified symptoms and signs involving the circulatory and respiratory systems: Secondary | ICD-10-CM

## 2019-07-31 ENCOUNTER — Ambulatory Visit
Admission: RE | Admit: 2019-07-31 | Discharge: 2019-07-31 | Disposition: A | Discharge status: Home / Self Care | Payer: Medicare Other | Source: Ambulatory Visit | Attending: Family Medicine | Admitting: Family Medicine

## 2019-07-31 DIAGNOSIS — R0989 Other specified symptoms and signs involving the circulatory and respiratory systems: Secondary | ICD-10-CM

## 2021-01-18 DIAGNOSIS — R7303 Prediabetes: Secondary | ICD-10-CM | POA: Diagnosis not present

## 2021-01-18 DIAGNOSIS — E7800 Pure hypercholesterolemia, unspecified: Secondary | ICD-10-CM | POA: Diagnosis not present

## 2021-07-29 DIAGNOSIS — R7303 Prediabetes: Secondary | ICD-10-CM | POA: Diagnosis not present

## 2021-07-29 DIAGNOSIS — E538 Deficiency of other specified B group vitamins: Secondary | ICD-10-CM | POA: Diagnosis not present

## 2021-07-29 DIAGNOSIS — R0789 Other chest pain: Secondary | ICD-10-CM | POA: Diagnosis not present

## 2021-07-29 DIAGNOSIS — E7800 Pure hypercholesterolemia, unspecified: Secondary | ICD-10-CM | POA: Diagnosis not present

## 2021-07-29 DIAGNOSIS — G629 Polyneuropathy, unspecified: Secondary | ICD-10-CM | POA: Diagnosis not present

## 2021-07-29 DIAGNOSIS — Z0000 Encounter for general adult medical examination without abnormal findings: Secondary | ICD-10-CM | POA: Diagnosis not present

## 2021-07-29 DIAGNOSIS — Z1389 Encounter for screening for other disorder: Secondary | ICD-10-CM | POA: Diagnosis not present

## 2021-07-29 DIAGNOSIS — G4700 Insomnia, unspecified: Secondary | ICD-10-CM | POA: Diagnosis not present

## 2021-07-29 DIAGNOSIS — M8580 Other specified disorders of bone density and structure, unspecified site: Secondary | ICD-10-CM | POA: Diagnosis not present

## 2021-08-03 DIAGNOSIS — Z1211 Encounter for screening for malignant neoplasm of colon: Secondary | ICD-10-CM | POA: Diagnosis not present

## 2021-09-14 DIAGNOSIS — Z1231 Encounter for screening mammogram for malignant neoplasm of breast: Secondary | ICD-10-CM | POA: Diagnosis not present

## 2021-09-14 DIAGNOSIS — Z780 Asymptomatic menopausal state: Secondary | ICD-10-CM | POA: Diagnosis not present

## 2021-09-14 DIAGNOSIS — M85852 Other specified disorders of bone density and structure, left thigh: Secondary | ICD-10-CM | POA: Diagnosis not present

## 2021-09-14 DIAGNOSIS — M85851 Other specified disorders of bone density and structure, right thigh: Secondary | ICD-10-CM | POA: Diagnosis not present

## 2021-11-24 ENCOUNTER — Other Ambulatory Visit: Payer: Self-pay | Admitting: Family Medicine

## 2021-11-24 ENCOUNTER — Ambulatory Visit
Admission: RE | Admit: 2021-11-24 | Discharge: 2021-11-24 | Disposition: A | Discharge status: Home / Self Care | Payer: Medicare Other | Source: Ambulatory Visit | Attending: Family Medicine | Admitting: Family Medicine

## 2021-11-24 DIAGNOSIS — R0600 Dyspnea, unspecified: Secondary | ICD-10-CM

## 2021-11-24 DIAGNOSIS — R079 Chest pain, unspecified: Secondary | ICD-10-CM | POA: Diagnosis not present

## 2021-11-24 DIAGNOSIS — R0602 Shortness of breath: Secondary | ICD-10-CM | POA: Diagnosis not present

## 2021-11-29 ENCOUNTER — Other Ambulatory Visit: Payer: Self-pay | Admitting: Family Medicine

## 2021-11-29 DIAGNOSIS — R0600 Dyspnea, unspecified: Secondary | ICD-10-CM

## 2021-11-29 DIAGNOSIS — Z853 Personal history of malignant neoplasm of breast: Secondary | ICD-10-CM

## 2021-11-30 ENCOUNTER — Other Ambulatory Visit (HOSPITAL_COMMUNITY): Payer: Self-pay | Admitting: Radiology

## 2021-11-30 DIAGNOSIS — R0600 Dyspnea, unspecified: Secondary | ICD-10-CM

## 2021-12-13 ENCOUNTER — Ambulatory Visit (HOSPITAL_COMMUNITY)
Admission: RE | Admit: 2021-12-13 | Discharge: 2021-12-13 | Disposition: A | Discharge status: Home / Self Care | Payer: Medicare Other | Source: Ambulatory Visit | Attending: Family Medicine | Admitting: Family Medicine

## 2021-12-13 DIAGNOSIS — R0600 Dyspnea, unspecified: Secondary | ICD-10-CM | POA: Diagnosis not present

## 2021-12-13 LAB — PULMONARY FUNCTION TEST
DL/VA % pred: 105 %
DL/VA: 4.23 ml/min/mmHg/L
DLCO unc % pred: 92 %
DLCO unc: 19.63 ml/min/mmHg
FEF 25-75 Post: 2.64 L/sec
FEF 25-75 Pre: 2.44 L/sec
FEF2575-%Change-Post: 8 %
FEF2575-%Pred-Post: 143 %
FEF2575-%Pred-Pre: 132 %
FEV1-%Change-Post: 2 %
FEV1-%Pred-Post: 102 %
FEV1-%Pred-Pre: 100 %
FEV1-Post: 2.47 L
FEV1-Pre: 2.42 L
FEV1FVC-%Change-Post: 3 %
FEV1FVC-%Pred-Pre: 108 %
FEV6-%Change-Post: -1 %
FEV6-%Pred-Post: 96 %
FEV6-%Pred-Pre: 98 %
FEV6-Post: 2.95 L
FEV6-Pre: 2.99 L
FEV6FVC-%Pred-Post: 104 %
FEV6FVC-%Pred-Pre: 104 %
FVC-%Change-Post: -1 %
FVC-%Pred-Post: 92 %
FVC-%Pred-Pre: 93 %
FVC-Post: 2.95 L
FVC-Pre: 2.99 L
Post FEV1/FVC ratio: 84 %
Post FEV6/FVC ratio: 100 %
Pre FEV1/FVC ratio: 81 %
Pre FEV6/FVC Ratio: 100 %
RV % pred: 99 %
RV: 2.44 L
TLC % pred: 99 %
TLC: 5.46 L

## 2021-12-13 MED ORDER — ALBUTEROL SULFATE (2.5 MG/3ML) 0.083% IN NEBU
2.5000 mg | INHALATION_SOLUTION | Freq: Once | RESPIRATORY_TRACT | Status: AC
  Administered 2021-12-13: 2.5 mg via RESPIRATORY_TRACT

## 2021-12-20 ENCOUNTER — Ambulatory Visit
Admission: RE | Admit: 2021-12-20 | Discharge: 2021-12-20 | Disposition: A | Discharge status: Home / Self Care | Payer: Medicare Other | Source: Ambulatory Visit | Attending: Family Medicine | Admitting: Family Medicine

## 2021-12-20 DIAGNOSIS — I700 Atherosclerosis of aorta: Secondary | ICD-10-CM | POA: Diagnosis not present

## 2021-12-20 DIAGNOSIS — Z853 Personal history of malignant neoplasm of breast: Secondary | ICD-10-CM | POA: Diagnosis not present

## 2021-12-20 DIAGNOSIS — J984 Other disorders of lung: Secondary | ICD-10-CM | POA: Diagnosis not present

## 2021-12-20 DIAGNOSIS — R0600 Dyspnea, unspecified: Secondary | ICD-10-CM

## 2021-12-20 MED ORDER — IOPAMIDOL (ISOVUE-300) INJECTION 61%
75.0000 mL | Freq: Once | INTRAVENOUS | Status: AC | PRN
  Administered 2021-12-20: 75 mL via INTRAVENOUS

## 2022-01-12 ENCOUNTER — Encounter: Payer: Self-pay | Admitting: Cardiology

## 2022-01-12 ENCOUNTER — Ambulatory Visit: Payer: Medicare Other | Admitting: Cardiology

## 2022-01-12 VITALS — BP 132/74 | HR 80 | Ht 67.0 in | Wt 203.2 lb

## 2022-01-12 DIAGNOSIS — R0609 Other forms of dyspnea: Secondary | ICD-10-CM | POA: Diagnosis not present

## 2022-01-12 DIAGNOSIS — Z79899 Other long term (current) drug therapy: Secondary | ICD-10-CM

## 2022-01-12 DIAGNOSIS — E669 Obesity, unspecified: Secondary | ICD-10-CM

## 2022-01-12 DIAGNOSIS — Z7689 Persons encountering health services in other specified circumstances: Secondary | ICD-10-CM | POA: Diagnosis not present

## 2022-01-12 LAB — BASIC METABOLIC PANEL
BUN/Creatinine Ratio: 6 — ABNORMAL LOW (ref 12–28)
BUN: 5 mg/dL — ABNORMAL LOW (ref 8–27)
CO2: 24 mmol/L (ref 20–29)
Calcium: 8.9 mg/dL (ref 8.7–10.3)
Chloride: 99 mmol/L (ref 96–106)
Creatinine, Ser: 0.81 mg/dL (ref 0.57–1.00)
Glucose: 91 mg/dL (ref 70–99)
Potassium: 4.2 mmol/L (ref 3.5–5.2)
Sodium: 136 mmol/L (ref 134–144)
eGFR: 76 mL/min/{1.73_m2} (ref >59)

## 2022-01-12 LAB — MAGNESIUM: Magnesium: 2.5 mg/dL — ABNORMAL HIGH (ref 1.6–2.3)

## 2022-01-12 MED ORDER — METOPROLOL TARTRATE 100 MG PO TABS: ORAL_TABLET | ORAL | 0 refills | Status: AC

## 2022-01-12 NOTE — Patient Instructions (Addendum)
Medication Instructions:  ?Your physician recommends that you continue on your current medications as directed. Please refer to the Current Medication list given to you today.  ?*If you need a refill on your cardiac medications before your next appointment, please call your pharmacy* ? ? ?Lab Work: ?Your physician recommends that you return for lab work in:  ?TODAY: BMET, Mag ?If you have labs (blood work) drawn today and your tests are completely normal, you will receive your results only by: ?MyChart Message (if you have MyChart) OR ?A paper copy in the mail ?If you have any lab test that is abnormal or we need to change your treatment, we will call you to review the results. ? ? ?Testing/Procedures: ?Your physician has requested that you have an echocardiogram. Echocardiography is a painless test that uses sound waves to create images of your heart. It provides your doctor with information about the size and shape of your heart and how well your heart?s chambers and valves are working. This procedure takes approximately one hour. There are no restrictions for this procedure. ? ? ? ?Your cardiac CT will be scheduled at one of the below locations:  ? ?[facility] ?[address] ?[place], [place] [address] ?[phone] ? ? ?If scheduled at [facility], please arrive at the [facility] Entrance (Entrance C2) of [facility] 30 minutes prior to test start time. ?You can use the FREE valet parking offered at entrance C (encouraged to control the heart rate for the test)  ?Proceed to the [facility] Radiology Department (first floor) to check-in and test prep. ? ?All radiology patients and guests should use entrance C2 at [facility], accessed from [facility], even though the hospital's physical address listed is [address] ? ? ? ? ?Please follow these instructions carefully (unless otherwise directed): ? ?On the Night Before the Test: ?Be sure  to Drink plenty of water. ?Do not consume any caffeinated/decaffeinated beverages or chocolate 12 hours prior to your test. ?Do not take any antihistamines 12 hours prior to your test. ? ? ?On the Day of the Test: ?Drink plenty of water until 1 hour prior to the test. ?Do not eat any food 4 hours prior to the test. ?You may take your regular medications prior to the test.  ?Take metoprolol (Lopressor) two hours prior to test. ?HOLD Furosemide/Hydrochlorothiazide morning of the test. ?FEMALES- please wear underwire-free bra if available, avoid dresses & tight clothing ? ? ?After the Test: ?Drink plenty of water. ?After receiving IV contrast, you may experience a mild flushed feeling. This is normal. ?On occasion, you may experience a mild rash up to 24 hours after the test. This is not dangerous. If this occurs, you can take Benadryl 25 mg and increase your fluid intake. ?If you experience trouble breathing, this can be serious. If it is severe call 911 IMMEDIATELY. If it is mild, please call our office. ?If you take any of these medications: Glipizide/Metformin, Avandament, Glucavance, please do not take 48 hours after completing test unless otherwise instructed. ? ?We will call to schedule your test 2-4 weeks out understanding that some insurance companies will need an authorization prior to the service being performed.  ? ?For non-scheduling related questions, please contact the cardiac imaging nurse navigator should you have any questions/concerns: ?Rockwell Alexandria, Cardiac Imaging Nurse Navigator ?Larey Brick, Cardiac Imaging Nurse Navigator ?[facility] ?Direct Office Dial: [phone]  ? ?For scheduling needs,  including cancellations and rescheduling, please call [place], [phone]. ? ? ? ?Follow-Up: ?At [facility], you and your health needs are our priority.  As part of our continuing mission to provide you with exceptional heart care, we have created designated Provider  Care Teams.  These Care Teams include your primary Cardiologist (physician) and Advanced Practice Providers (APPs -  Physician Assistants and Nurse Practitioners) who all work together to provide you with the care you need, when you need it. ? ?We recommend signing up for the patient portal called "MyChart".  Sign up information is provided on this After Visit Summary.  MyChart is used to connect with patients for Virtual Visits (Telemedicine).  Patients are able to view lab/test results, encounter notes, upcoming appointments, etc.  Non-urgent messages can be sent to your provider as well.   ?To learn more about what you can do with MyChart, go to [email or web address].   ? ?Your next appointment:   ?4 month(s) ? ?The format for your next appointment:   ?In Person ? ?Provider:   ?Thomasene Ripple, DO   ? ? ?Other Instructions ? ? ?Important Information About Sugar ? ? ? ? ?  ?

## 2022-01-12 NOTE — Progress Notes (Signed)
?Cardiology Office Note:   ? ?Date:  01/12/2022  ? ?ID:  Gina Jacobs, DOB 09-05-1945, MRN [id number] ? ?PCP:  Maurice Small, MD  ?Cardiologist:  Thomasene Ripple, DO  ?Electrophysiologist:  None  ? ?Referring MD: Ollen Bowl, MD  ? ?" I am having shortness of breath" ? ?History of Present Illness:   ? ?Gina Jacobs is a 76 y.o. female with a hx of breast cancer back in 2019 status post surgery, radiation and chemotherapy.  ? ?The patient tells that she has been experiencing intermittent shortness of breath.  She notes that is progressingly worse.  I first she said back in 2009 she experienced similar symptoms but eventually it improved.  This time is not going away.  No other complaints at this time. ? ?Past Medical History:  ?Diagnosis Date  ? History of breast cancer   ? Prediabetes   ? ? ?Past Surgical History:  ?Procedure Laterality Date  ? BREAST SURGERY    ? ? ?Current Medications: ?Current Meds  ?Medication Sig  ? acetaminophen (TYLENOL) 500 MG tablet Take 500 mg by mouth as needed.  ? Cholecalciferol (VITAMIN D3 PO) Take by mouth.  ? Cyanocobalamin (VITAMIN B-12 PO) Take by mouth.  ? escitalopram (LEXAPRO) 10 MG tablet Take 10 mg by mouth daily.  ? IBUPROFEN PO Take by mouth as needed.  ? metoprolol tartrate (LOPRESSOR) 100 MG tablet Take 2 hours prior to CT  ? Probiotic TBEC 1 tablet  ? zolpidem (AMBIEN CR) 12.5 MG CR tablet Take 12.5 mg by mouth at bedtime as needed.  ?  ? ?Allergies:   Patient has no known allergies.  ? ?Social History  ? ?Socioeconomic History  ? Marital status: Divorced  ?  Spouse name: Not on file  ? Number of children: Not on file  ? Years of education: Not on file  ? Highest education level: Not on file  ?Occupational History  ? Not on file  ?Tobacco Use  ? Smoking status: Never  ? Smokeless tobacco: Never  ?Substance and Sexual Activity  ? Alcohol use: Not on file  ? Drug use: Not on file  ? Sexual activity: Not on file  ?Other Topics Concern  ? Not on file  ?Social History  Narrative  ? Not on file  ? ?Social Determinants of Health  ? ?Financial Resource Strain: Not on file  ?Food Insecurity: Not on file  ?Transportation Needs: Not on file  ?Physical Activity: Not on file  ?Stress: Not on file  ?Social Connections: Not on file  ?  ? ?Family History: ?The patient's family history includes Heart failure in her father and mother; Hypertension in her mother. ? ?ROS:   ?Review of Systems  ?Constitution: Negative for decreased appetite, fever and weight gain.  ?HENT: Negative for congestion, ear discharge, hoarse voice and sore throat.   ?Eyes: Negative for discharge, redness, vision loss in right eye and visual halos.  ?Cardiovascular: Negative for chest pain, dyspnea on exertion, leg swelling, orthopnea and palpitations.  ?Respiratory: Negative for cough, hemoptysis, shortness of breath and snoring.   ?Endocrine: Negative for heat intolerance and polyphagia.  ?Hematologic/Lymphatic: Negative for bleeding problem. Does not bruise/bleed easily.  ?Skin: Negative for flushing, nail changes, rash and suspicious lesions.  ?Musculoskeletal: Negative for arthritis, joint pain, muscle cramps, myalgias, neck pain and stiffness.  ?Gastrointestinal: Negative for abdominal pain, bowel incontinence, diarrhea and excessive appetite.  ?Genitourinary: Negative for decreased libido, genital sores and incomplete emptying.  ?Neurological: Negative for brief  paralysis, focal weakness, headaches and loss of balance.  ?Psychiatric/Behavioral: Negative for altered mental status, depression and suicidal ideas.  ?Allergic/Immunologic: Negative for HIV exposure and persistent infections.  ? ? ?EKGs/Labs/Other Studies Reviewed:   ? ?The following studies were reviewed today: ? ? ?EKG:  The ekg ordered today demonstrates sinus rhythm with normal heart rate 80 bpm. ? ? ?Recent Labs: ?No results found for requested labs within last 8760 hours.  ?Recent Lipid Panel ?No results found for: CHOL, TRIG, HDL, CHOLHDL, VLDL,  LDLCALC, LDLDIRECT ? ?Physical Exam:   ? ?VS:  BP 132/74 (BP Location: Right Arm, Patient Position: Sitting, Cuff Size: Large)   Pulse 80   Ht 5\' 7"  (1.702 m)   Wt 203 lb 3.2 oz (92.2 kg)   LMP  (LMP Unknown)   SpO2 96%   BMI 31.83 kg/m?    ? ?Wt Readings from Last 3 Encounters:  ?01/12/22 203 lb 3.2 oz (92.2 kg)  ?  ? ?GEN: Well nourished, well developed in no acute distress ?HEENT: Normal ?NECK: No JVD; No carotid bruits ?LYMPHATICS: No lymphadenopathy ?CARDIAC: S1S2 noted,RRR, no murmurs, rubs, gallops ?RESPIRATORY:  Clear to auscultation without rales, wheezing or rhonchi  ?ABDOMEN: Soft, non-tender, non-distended, +bowel sounds, no guarding. ?EXTREMITIES: No edema, No cyanosis, no clubbing ?MUSCULOSKELETAL:  No deformity  ?SKIN: Warm and dry ?NEUROLOGIC:  Alert and oriented x 3, non-focal ?PSYCHIATRIC:  Normal affect, good insight ? ?ASSESSMENT:   ? ?1. DOE (dyspnea on exertion)   ?2. Encounter to establish care with new doctor   ?3. Medication management   ?4. Obesity (BMI 30-39.9)   ? ?PLAN:   ? ?She has risk factors and there is history of radiation which also increases her risk for coronary atherosclerosis therefore with the symptoms of dyspnea on exertion is concerning, I would like to pursue an ischemic evaluation in this patient.  Shared decision a coronary CTA at this time is appropriate.  I have discussed with the patient about the testing.  The patient has no IV contrast allergy and is agreeable to proceed with this test. ? ?In addition we will get an echocardiogram to assess LV function and any other valvular abnormalities. ? ?The patient understands the need to lose weight with diet and exercise. We have discussed specific strategies for this. ? ?The patient is in agreement with the above plan. The patient left the office in stable condition.  The patient will follow up in 4 months or sooner if needed. ? ?Medication Adjustments/Labs and Tests Ordered: ?Current medicines are reviewed at length  with the patient today.  Concerns regarding medicines are outlined above.  ?Orders Placed This Encounter  ?Procedures  ? CT CORONARY MORPH W/CTA COR W/SCORE W/CA W/CM &/OR WO/CM  ? Basic Metabolic Panel (BMET)  ? Magnesium  ? EKG 12-Lead  ? ECHOCARDIOGRAM COMPLETE  ? ?Meds ordered this encounter  ?Medications  ? metoprolol tartrate (LOPRESSOR) 100 MG tablet  ?  Sig: Take 2 hours prior to CT  ?  Dispense:  1 tablet  ?  Refill:  0  ? ? ?Patient Instructions  ?Medication Instructions:  ?Your physician recommends that you continue on your current medications as directed. Please refer to the Current Medication list given to you today.  ?*If you need a refill on your cardiac medications before your next appointment, please call your pharmacy* ? ? ?Lab Work: ?Your physician recommends that you return for lab work in:  ?TODAY: BMET, Mag ?If you have labs (blood work) drawn today  and your tests are completely normal, you will receive your results only by: ?MyChart Message (if you have MyChart) OR ?A paper copy in the mail ?If you have any lab test that is abnormal or we need to change your treatment, we will call you to review the results. ? ? ?Testing/Procedures: ?Your physician has requested that you have an echocardiogram. Echocardiography is a painless test that uses sound waves to create images of your heart. It provides your doctor with information about the size and shape of your heart and how well your heart?s chambers and valves are working. This procedure takes approximately one hour. There are no restrictions for this procedure. ? ? ? ?Your cardiac CT will be scheduled at one of the below locations:  ? ?[facility] ?[address] ?[place], [place] [address] ?[phone] ? ? ?If scheduled at [facility], [facility]Entrance C2[facility] 30 minutes prior to test start time. ?You can use the FREE valet parking offered at entrance C  (encouraged to control the heart rate for the test)  ?Proceed to the [facility] Department (first floor) to check-in and test prep. ? ?All radiology patients and guests should use entrance C2 at [facility]

## 2022-02-02 ENCOUNTER — Ambulatory Visit (HOSPITAL_COMMUNITY): Payer: Medicare Other | Attending: Cardiovascular Disease

## 2022-02-02 DIAGNOSIS — R0609 Other forms of dyspnea: Secondary | ICD-10-CM | POA: Insufficient documentation

## 2022-02-02 LAB — ECHOCARDIOGRAM COMPLETE
Area-P 1/2: 4.21 cm2
Calc EF: 62.5 %
S' Lateral: 3.05 cm
Single Plane A2C EF: 67.1 %
Single Plane A4C EF: 59.8 %

## 2022-02-03 ENCOUNTER — Telehealth (HOSPITAL_COMMUNITY): Payer: Self-pay | Admitting: *Deleted

## 2022-02-03 NOTE — Telephone Encounter (Signed)
Reaching out to patient to offer assistance regarding upcoming cardiac imaging study; pt verbalizes understanding of appt date/time, parking situation and where to check in, pre-test NPO status and medications ordered, and verified current allergies; name and call back number provided for further questions should they arise  Herman Fiero RN Navigator Cardiac Imaging [facility] [phone] office [phone] cell  Patient to take 100mg metoprolol tartrate two hours prior to her cardiac CT scan. She is aware to arrive at 9:30am. 

## 2022-02-04 ENCOUNTER — Ambulatory Visit (HOSPITAL_COMMUNITY)
Admission: RE | Admit: 2022-02-04 | Discharge: 2022-02-04 | Disposition: A | Discharge status: Home / Self Care | Payer: Medicare Other | Source: Ambulatory Visit | Attending: Cardiology | Admitting: Cardiology

## 2022-02-04 DIAGNOSIS — R079 Chest pain, unspecified: Secondary | ICD-10-CM | POA: Insufficient documentation

## 2022-02-04 DIAGNOSIS — R0609 Other forms of dyspnea: Secondary | ICD-10-CM | POA: Diagnosis not present

## 2022-02-04 IMAGING — CT CT HEART MORP W/ CTA COR W/ SCORE W/ CA W/CM &/OR W/O CM
4 of 7 series · 8 of 20 positions shown, 9 images · non-contrast
Comparison: CT 12/20/2021.

Addendum:
CLINICAL DATA: Chest pain

EXAM:
Cardiac CTA
MEDICATIONS:
Sub lingual nitro. 4mg and lopressor 100mg
TECHNIQUE: The patient was scanned on a Siemens Force [REDACTED]ice scanner. Gantry
rotation speed was 250 msecs. Collimation was .6 mm. A 100 kV
prospective scan was triggered in the ascending thoracic aorta at
140 HU's Full mA was used between 35% and 75% of the R-R interval.
Average HR during the scan was 51 bpm. The 3D data set was
interpreted on a dedicated work station using MPR, MIP and VRT
modes. A total of 80cc of contrast was used.

[Series 6: ts diast sharp · axial · 0.39mm/px · z∈[+1288,+1325]mm · 2 of 273 slices shown]
[im 91/273  lung]
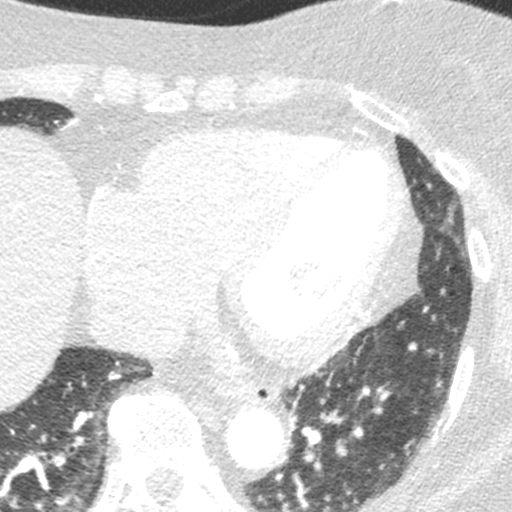
[im 182/273  lung]
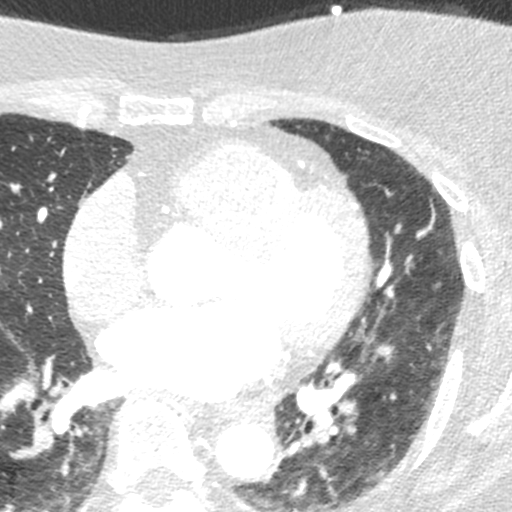

[Series 7: ts syst sharp · axial · 0.39mm/px · z∈[+1288,+1325]mm · 2 of 273 slices shown]
[im 91/273  lung]
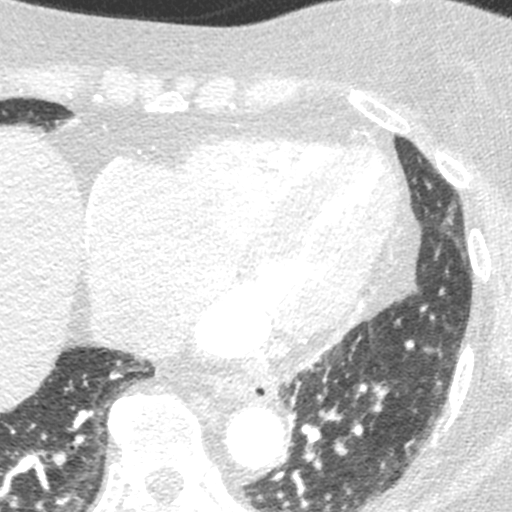
[im 182/273  lung]
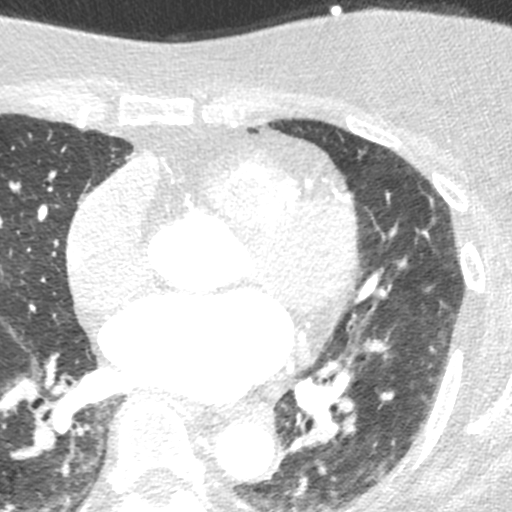

[Series 8: best syst · axial · 0.39mm/px · z∈[+1288,+1325]mm · 2 of 273 slices shown, 3 images]
[im 91/273  vessel]
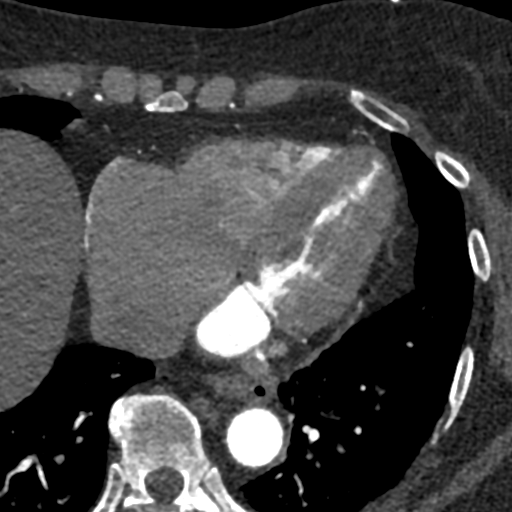
[im 91/273  lung]
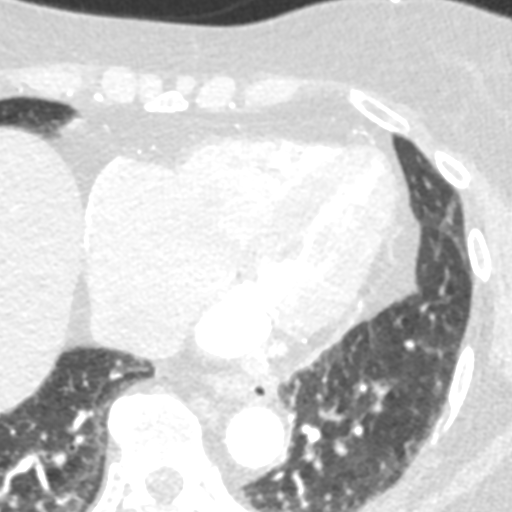
[im 182/273  vessel]
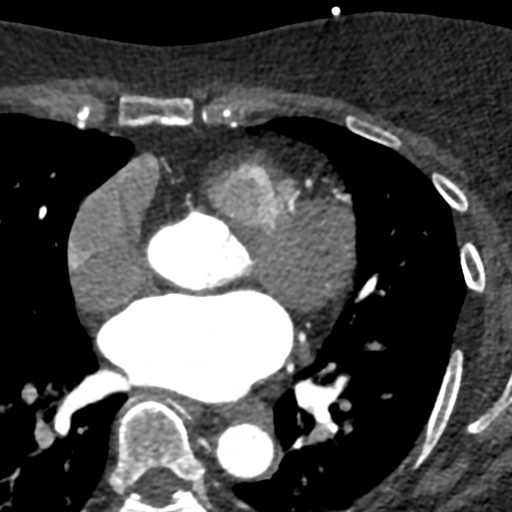

[Series 9: best diast · axial · 0.39mm/px · z∈[+1288,+1325]mm · 2 of 273 slices shown]
[im 91/273  vessel]
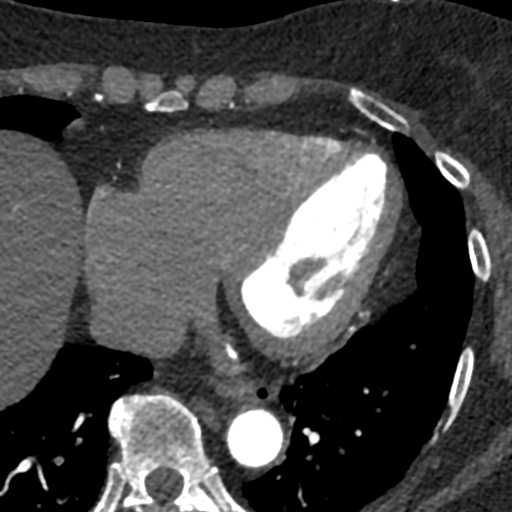
[im 182/273  vessel]
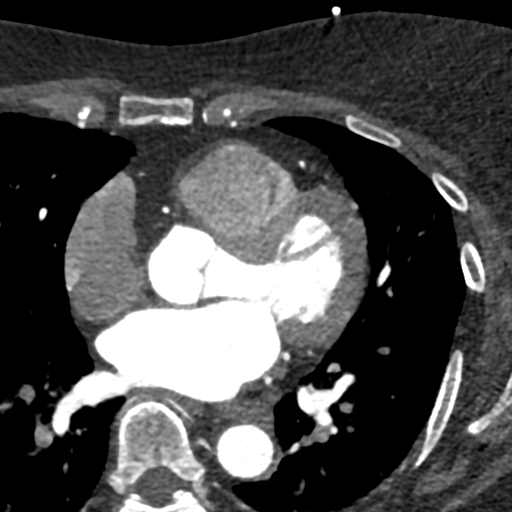

[8 of 20 positions shown; findings below may reference images not displayed]

FINDINGS: Non-cardiac: See separate report from [REDACTED]. No
significant findings on limited lung and soft tissue windows.

Calcium Score: No calcium noted

Coronary Arteries: Left dominant with no anomalies

LM: Normal

LAD:  Normal

D1: Normal

D2: Normal

Circumflex: Normal

OM1: Normal

OM2: Normal

PDA:  Normal

PLB: Normal

RCA: Small non dominant and normal
IMPRESSION: 1.  Normal diameter aorta 3.4 cm

2.  Calcium score 0

3.  Normal Left  dominant coronary arteries CAD RADS 0

Azis Cii Bocah Amimatori

EXAM:
OVER-READ INTERPRETATION  CT CHEST

The following report is an over-read performed by radiologist Dr.
does not include interpretation of cardiac or coronary anatomy or
pathology. The coronary CTA interpretation by the cardiologist is
attached.
FINDINGS: Vascular: No significant extracardiac vascular findings.

Mediastinum/Nodes: No lymphadenopathy.

Lungs/Pleura: No focal airspace disease. Bibasilar hypoventilatory
changes. No suspicious pulmonary nodules.

Upper Abdomen: No acute findings.

Musculoskeletal: No acute osseous abnormality. No suspicious osseous
lesion. Degenerative changes of the spine.
IMPRESSION: No acute or significant incidental extracardiac findings in the
chest.

*** End of Addendum ***
FINDINGS: Non-cardiac: See separate report from [REDACTED]. No
significant findings on limited lung and soft tissue windows.

Calcium Score: No calcium noted

Coronary Arteries: Left dominant with no anomalies

LM: Normal

LAD:  Normal

D1: Normal

D2: Normal

Circumflex: Normal

OM1: Normal

OM2: Normal

PDA:  Normal

PLB: Normal

RCA: Small non dominant and normal
IMPRESSION: 1.  Normal diameter aorta 3.4 cm

2.  Calcium score 0

3.  Normal Left  dominant coronary arteries CAD RADS 0

Azis Cii Bocah Amimatori

## 2022-02-04 MED ORDER — NITROGLYCERIN 0.4 MG SL SUBL
0.8000 mg | SUBLINGUAL_TABLET | Freq: Once | SUBLINGUAL | Status: AC
  Administered 2022-02-04: 0.8 mg via SUBLINGUAL

## 2022-02-04 MED ORDER — NITROGLYCERIN 0.4 MG SL SUBL
SUBLINGUAL_TABLET | SUBLINGUAL | Status: AC
  Filled 2022-02-04: qty 2

## 2022-02-04 MED ORDER — IOHEXOL 350 MG/ML SOLN
100.0000 mL | Freq: Once | INTRAVENOUS | Status: AC | PRN
  Administered 2022-02-04: 100 mL via INTRAVENOUS

## 2022-02-15 ENCOUNTER — Telehealth: Payer: Self-pay | Admitting: Cardiology

## 2022-02-15 NOTE — Telephone Encounter (Signed)
Patient is returning call for her CT results. Transferred to RN.

## 2022-02-15 NOTE — Telephone Encounter (Signed)
Spoke to pt, see chart.  

## 2022-03-30 DIAGNOSIS — M545 Low back pain, unspecified: Secondary | ICD-10-CM | POA: Diagnosis not present

## 2022-03-30 DIAGNOSIS — G629 Polyneuropathy, unspecified: Secondary | ICD-10-CM | POA: Diagnosis not present

## 2022-05-06 DIAGNOSIS — G629 Polyneuropathy, unspecified: Secondary | ICD-10-CM | POA: Diagnosis not present

## 2022-05-06 DIAGNOSIS — R030 Elevated blood-pressure reading, without diagnosis of hypertension: Secondary | ICD-10-CM | POA: Diagnosis not present

## 2022-05-06 DIAGNOSIS — E7800 Pure hypercholesterolemia, unspecified: Secondary | ICD-10-CM | POA: Diagnosis not present

## 2022-05-06 DIAGNOSIS — R7303 Prediabetes: Secondary | ICD-10-CM | POA: Diagnosis not present

## 2022-05-06 DIAGNOSIS — R251 Tremor, unspecified: Secondary | ICD-10-CM | POA: Diagnosis not present

## 2022-05-19 ENCOUNTER — Ambulatory Visit: Payer: Medicare Other | Admitting: Cardiology

## 2022-06-02 ENCOUNTER — Ambulatory Visit: Payer: Medicare Other | Admitting: Neurology

## 2022-06-07 ENCOUNTER — Encounter: Payer: Self-pay | Admitting: Diagnostic Neuroimaging

## 2022-06-07 ENCOUNTER — Ambulatory Visit (INDEPENDENT_AMBULATORY_CARE_PROVIDER_SITE_OTHER): Payer: Medicare Other | Admitting: Diagnostic Neuroimaging

## 2022-06-07 ENCOUNTER — Telehealth: Payer: Self-pay | Admitting: Diagnostic Neuroimaging

## 2022-06-07 VITALS — Ht 67.0 in | Wt 207.5 lb

## 2022-06-07 DIAGNOSIS — R4689 Other symptoms and signs involving appearance and behavior: Secondary | ICD-10-CM | POA: Diagnosis not present

## 2022-06-07 NOTE — Telephone Encounter (Signed)
[employer] medicare NPR sent to GI [phone] 

## 2022-06-07 NOTE — Progress Notes (Signed)
[facility]  PATIENT: Gina Jacobs DOB: [birth year]  REFERRING CLINICIAN: Kelton Pillar, MD HISTORY FROM: patient REASON FOR VISIT: new consult   HISTORICAL  CHIEF COMPLAINT:  Chief Complaint  Patient presents with   New Patient (Initial Visit)    Pt reports feeling good. She states feeling the tingling sensation all over her body and has gotten worse over time. She is experiencing twitches in mostly left but sometimes all over as well. She sometimes has pain that radiates over Room 6 alone    HISTORY OF PRESENT ILLNESS:   76 year old female here for evaluation of pain, numbness, jerking sensation.  2002 patient had breast cancer diagnosis treated with chemotherapy.  She had some nausea during treatment but otherwise did well.  No numbness, tingling, twitching or tremor at that time.  2009 patient had onset of abnormal spells consisting of brain fog, numbness, abnormal sensations, tremors.  Symptoms typically triggered by stressful situations.  Sometimes this would include being around her family members.  Sometimes this could include being out in public.  Other times symptoms could occur without any triggering factor.  Symptoms could last hours, up to 1 day at a time.  Sometimes episodes last only 15 minutes.  No loss of consciousness.  She saw 2 neurologists at that time who did extensive work-up but no specific cause was found.  More recently patient having some intermittent muscle jerks in legs, numbness, tingling.  Consult requested to evaluate for chemotherapy neuropathy.    REVIEW OF SYSTEMS: Full 14 system review of systems performed and negative with exception of: as per HPI.  ALLERGIES: No Known Allergies  HOME MEDICATIONS: Outpatient Medications Prior to Visit  Medication Sig Dispense Refill   acetaminophen (TYLENOL) 500 MG tablet Take 500 mg by mouth as needed.     Cholecalciferol (VITAMIN D3 PO) Take by mouth.     Cyanocobalamin (VITAMIN  B-12 PO) Take by mouth.     escitalopram (LEXAPRO) 10 MG tablet Take 10 mg by mouth daily.     IBUPROFEN PO Take by mouth as needed.     Probiotic TBEC 1 tablet     traMADol (ULTRAM) 50 MG tablet Take by mouth every 6 (six) hours as needed.     zolpidem (AMBIEN CR) 12.5 MG CR tablet Take 12.5 mg by mouth at bedtime as needed.     metoprolol tartrate (LOPRESSOR) 100 MG tablet Take 2 hours prior to CT (Patient not taking: Reported on 06/07/2022) 1 tablet 0   No facility-administered medications prior to visit.    PAST MEDICAL HISTORY: Past Medical History:  Diagnosis Date   History of breast cancer    Prediabetes     PAST SURGICAL HISTORY: Past Surgical History:  Procedure Laterality Date   BREAST SURGERY      FAMILY HISTORY: Family History  Problem Relation Age of Onset   Heart failure Mother    Hypertension Mother    Heart failure Father     SOCIAL HISTORY: Social History   Socioeconomic History   Marital status: Divorced    Spouse name: Not on file   Number of children: Not on file   Years of education: Not on file   Highest education level: Not on file  Occupational History   Not on file  Tobacco Use   Smoking status: Never   Smokeless tobacco: Never  Substance and Sexual Activity   Alcohol use: Not on file   Drug use: Not on file   Sexual activity:  Not on file  Other Topics Concern   Not on file  Social History Narrative   Not on file   Social Determinants of Health   Financial Resource Strain: Not on file  Food Insecurity: Not on file  Transportation Needs: Not on file  Physical Activity: Not on file  Stress: Not on file  Social Connections: Not on file  Intimate Partner Violence: Not on file     PHYSICAL EXAM  GENERAL EXAM/CONSTITUTIONAL: Vitals:  Vitals:   06/07/22 1446  Weight: 207 lb 8 oz (94.1 kg)  Height: '5\' 7"'  (1.702 m)   Body mass index is 32.5 kg/m. Wt Readings from Last 3 Encounters:  06/07/22 207 lb 8 oz (94.1 kg)   01/12/22 203 lb 3.2 oz (92.2 kg)   Patient is in no distress; well developed, nourished and groomed; neck is supple  CARDIOVASCULAR: Examination of carotid arteries is normal; no carotid bruits Regular rate and rhythm, no murmurs Examination of peripheral vascular system by observation and palpation is normal  EYES: Ophthalmoscopic exam of optic discs and posterior segments is normal; no papilledema or hemorrhages No results found.  MUSCULOSKELETAL: Gait, strength, tone, movements noted in Neurologic exam below  NEUROLOGIC: MENTAL STATUS:      No data to display         awake, alert, oriented to person, place and time recent and remote memory intact normal attention and concentration language fluent, comprehension intact, naming intact fund of knowledge appropriate  CRANIAL NERVE:  2nd - no papilledema on fundoscopic exam 2nd, 3rd, 4th, 6th - pupils equal and reactive to light, visual fields full to confrontation, extraocular muscles intact, no nystagmus 5th - facial sensation symmetric 7th - facial strength symmetric 8th - hearing intact 9th - palate elevates symmetrically, uvula midline 11th - shoulder shrug symmetric 12th - tongue protrusion midline  MOTOR:  normal bulk and tone, full strength in the BUE, BLE  SENSORY:  normal and symmetric to light touch, PINPRICK, temperature, vibration  COORDINATION:  finger-nose-finger, fine finger movements normal  REFLEXES:  deep tendon reflexes TRACE and symmetric  GAIT/STATION:  narrow based gait     DIAGNOSTIC DATA (LABS, IMAGING, TESTING) - I reviewed patient records, labs, notes, testing and imaging myself where available.  Lab Results  Component Value Date   WBC 4.5 02/04/2008   HGB 14.8 02/04/2008   HCT 43.3 02/04/2008   MCV 87.4 02/04/2008   PLT 239 02/04/2008      Component Value Date/Time   NA 136 01/12/2022 1023   K 4.2 01/12/2022 1023   CL 99 01/12/2022 1023   CO2 24 01/12/2022 1023    GLUCOSE 91 01/12/2022 1023   GLUCOSE 88 02/04/2008 1100   BUN 5 (L) 01/12/2022 1023   CREATININE 0.81 01/12/2022 1023   CALCIUM 8.9 01/12/2022 1023   PROT 6.5 02/04/2008 1100   ALBUMIN 3.4 (L) 02/04/2008 1100   AST 15 02/04/2008 1100   ALT 10 02/04/2008 1100   ALKPHOS 50 02/04/2008 1100   BILITOT 0.9 02/04/2008 1100   GFRNONAA >60 02/04/2008 1100   GFRAA  02/04/2008 1100    >60        The eGFR has been calculated using the MDRD equation. This calculation has not been validated in all clinical   No results found for: "CHOL", "HDL", "LDLCALC", "LDLDIRECT", "TRIG", "CHOLHDL" No results found for: "HGBA1C" No results found for: "VITAMINB12" Lab Results  Component Value Date   TSH 1.605 Test methodology is 3rd generation TSH 02/05/2008  B12, TSH, A1c --> normal   ASSESSMENT AND PLAN  76 y.o. year old female here with:  Dx:  1. Spell of abnormal behavior     PLAN:  ABNORMAL SPELLS (brain fog, twitching, numbness; neuro exam normal; symptoms since 2009; situational with certain stresses and family members; possible stress reaction) - repeat MRI brain - daily physical activity / exercise (at least 15-30 minutes) - eat more plants / vegetables - increase social activities, brain stimulation, games, puzzles, hobbies, crafts, arts, music - aim for at least 7-8 hours sleep per night (or more)  Orders Placed This Encounter  Procedures   MR [place]   Return for pending test results.    Penni Bombard, MD 19/62/2297, 9:89 PM Certified in Neurology, Neurophysiology and Neuroimaging  [facility] [address], [place], [place] [address] [phone]

## 2022-06-07 NOTE — Patient Instructions (Addendum)
  ABNORMAL SPELLS (brain fog, twitching, numbness; neuro exam normal; symptoms since 2009; situational with certain stresses and family members; possible stress reaction) - repeat MRI brain - daily physical activity / exercise (at least 15-30 minutes) - eat more plants / vegetables - increase social activities, brain stimulation, games, puzzles, hobbies, crafts, arts, music - aim for at least 7-8 hours sleep per night (or more)

## 2022-06-17 ENCOUNTER — Ambulatory Visit
Admission: RE | Admit: 2022-06-17 | Discharge: 2022-06-17 | Disposition: A | Discharge status: Home / Self Care | Payer: Medicare Other | Source: Ambulatory Visit | Attending: Diagnostic Neuroimaging | Admitting: Diagnostic Neuroimaging

## 2022-06-17 DIAGNOSIS — R4689 Other symptoms and signs involving appearance and behavior: Secondary | ICD-10-CM

## 2022-06-17 MED ORDER — GADOPICLENOL 0.5 MMOL/ML IV SOLN
9.0000 mL | Freq: Once | INTRAVENOUS | Status: AC | PRN
  Administered 2022-06-17: 9 mL via INTRAVENOUS

## 2022-06-22 ENCOUNTER — Telehealth: Payer: Self-pay

## 2022-06-22 NOTE — Telephone Encounter (Signed)
Contacted pt, informed her of results. Advised to call back with questions as she had none at this time and was appreciative.

## 2022-06-22 NOTE — Telephone Encounter (Signed)
-----   Message from Penni Bombard, MD sent at 06/20/2022  5:00 PM EDT ----- Unremarkable imaging results. Please call patient. Continue current plan. -VRP

## 2022-07-12 DIAGNOSIS — L259 Unspecified contact dermatitis, unspecified cause: Secondary | ICD-10-CM | POA: Diagnosis not present

## 2022-07-19 DIAGNOSIS — R21 Rash and other nonspecific skin eruption: Secondary | ICD-10-CM | POA: Diagnosis not present

## 2022-07-29 DIAGNOSIS — L258 Unspecified contact dermatitis due to other agents: Secondary | ICD-10-CM | POA: Diagnosis not present

## 2022-08-02 DIAGNOSIS — G629 Polyneuropathy, unspecified: Secondary | ICD-10-CM | POA: Diagnosis not present

## 2022-08-02 DIAGNOSIS — M8580 Other specified disorders of bone density and structure, unspecified site: Secondary | ICD-10-CM | POA: Diagnosis not present

## 2022-08-02 DIAGNOSIS — R7303 Prediabetes: Secondary | ICD-10-CM | POA: Diagnosis not present

## 2022-08-02 DIAGNOSIS — R251 Tremor, unspecified: Secondary | ICD-10-CM | POA: Diagnosis not present

## 2022-08-02 DIAGNOSIS — K589 Irritable bowel syndrome without diarrhea: Secondary | ICD-10-CM | POA: Diagnosis not present

## 2022-08-02 DIAGNOSIS — G4700 Insomnia, unspecified: Secondary | ICD-10-CM | POA: Diagnosis not present

## 2022-08-02 DIAGNOSIS — I700 Atherosclerosis of aorta: Secondary | ICD-10-CM | POA: Diagnosis not present

## 2022-08-02 DIAGNOSIS — Z0000 Encounter for general adult medical examination without abnormal findings: Secondary | ICD-10-CM | POA: Diagnosis not present

## 2022-08-02 DIAGNOSIS — Z66 Do not resuscitate: Secondary | ICD-10-CM | POA: Diagnosis not present

## 2022-08-02 DIAGNOSIS — E538 Deficiency of other specified B group vitamins: Secondary | ICD-10-CM | POA: Diagnosis not present

## 2022-08-02 DIAGNOSIS — E7800 Pure hypercholesterolemia, unspecified: Secondary | ICD-10-CM | POA: Diagnosis not present

## 2022-10-20 ENCOUNTER — Other Ambulatory Visit: Payer: Self-pay | Admitting: Internal Medicine

## 2022-10-20 DIAGNOSIS — M5415 Radiculopathy, thoracolumbar region: Secondary | ICD-10-CM | POA: Diagnosis not present

## 2022-10-20 DIAGNOSIS — M546 Pain in thoracic spine: Secondary | ICD-10-CM | POA: Diagnosis not present

## 2022-10-20 DIAGNOSIS — M545 Low back pain, unspecified: Secondary | ICD-10-CM | POA: Diagnosis not present

## 2022-10-20 DIAGNOSIS — M5414 Radiculopathy, thoracic region: Secondary | ICD-10-CM

## 2022-10-22 ENCOUNTER — Ambulatory Visit
Admission: RE | Admit: 2022-10-22 | Discharge: 2022-10-22 | Disposition: A | Discharge status: Home / Self Care | Payer: Medicare Other | Source: Ambulatory Visit | Attending: Internal Medicine | Admitting: Internal Medicine

## 2022-10-22 DIAGNOSIS — M5414 Radiculopathy, thoracic region: Secondary | ICD-10-CM

## 2022-10-22 DIAGNOSIS — M549 Dorsalgia, unspecified: Secondary | ICD-10-CM | POA: Diagnosis not present

## 2022-10-31 DIAGNOSIS — M5451 Vertebrogenic low back pain: Secondary | ICD-10-CM | POA: Diagnosis not present

## 2022-11-10 DIAGNOSIS — M5451 Vertebrogenic low back pain: Secondary | ICD-10-CM | POA: Diagnosis not present

## 2023-03-30 DIAGNOSIS — R208 Other disturbances of skin sensation: Secondary | ICD-10-CM | POA: Diagnosis not present

## 2023-03-30 DIAGNOSIS — I700 Atherosclerosis of aorta: Secondary | ICD-10-CM | POA: Diagnosis not present

## 2023-03-30 DIAGNOSIS — G629 Polyneuropathy, unspecified: Secondary | ICD-10-CM | POA: Diagnosis not present

## 2023-07-28 DIAGNOSIS — R1011 Right upper quadrant pain: Secondary | ICD-10-CM | POA: Diagnosis not present

## 2023-07-28 DIAGNOSIS — R110 Nausea: Secondary | ICD-10-CM | POA: Diagnosis not present

## 2023-08-04 DIAGNOSIS — I700 Atherosclerosis of aorta: Secondary | ICD-10-CM | POA: Diagnosis not present

## 2023-08-04 DIAGNOSIS — K589 Irritable bowel syndrome without diarrhea: Secondary | ICD-10-CM | POA: Diagnosis not present

## 2023-08-04 DIAGNOSIS — Z66 Do not resuscitate: Secondary | ICD-10-CM | POA: Diagnosis not present

## 2023-08-04 DIAGNOSIS — E538 Deficiency of other specified B group vitamins: Secondary | ICD-10-CM | POA: Diagnosis not present

## 2023-08-04 DIAGNOSIS — G629 Polyneuropathy, unspecified: Secondary | ICD-10-CM | POA: Diagnosis not present

## 2023-08-04 DIAGNOSIS — Z0000 Encounter for general adult medical examination without abnormal findings: Secondary | ICD-10-CM | POA: Diagnosis not present

## 2023-08-04 DIAGNOSIS — R7303 Prediabetes: Secondary | ICD-10-CM | POA: Diagnosis not present

## 2023-08-04 DIAGNOSIS — E7800 Pure hypercholesterolemia, unspecified: Secondary | ICD-10-CM | POA: Diagnosis not present

## 2023-08-04 DIAGNOSIS — A084 Viral intestinal infection, unspecified: Secondary | ICD-10-CM | POA: Diagnosis not present

## 2023-08-04 DIAGNOSIS — M5451 Vertebrogenic low back pain: Secondary | ICD-10-CM | POA: Diagnosis not present

## 2023-08-17 ENCOUNTER — Emergency Department (HOSPITAL_BASED_OUTPATIENT_CLINIC_OR_DEPARTMENT_OTHER)
Admission: EM | Admit: 2023-08-17 | Discharge: 2023-08-17 | Discharge status: Against Medical Advice | Payer: Medicare Other | Attending: Emergency Medicine | Admitting: Emergency Medicine

## 2023-08-17 ENCOUNTER — Encounter (HOSPITAL_BASED_OUTPATIENT_CLINIC_OR_DEPARTMENT_OTHER): Payer: Self-pay | Admitting: *Deleted

## 2023-08-17 ENCOUNTER — Emergency Department (HOSPITAL_BASED_OUTPATIENT_CLINIC_OR_DEPARTMENT_OTHER): Payer: Medicare Other

## 2023-08-17 ENCOUNTER — Other Ambulatory Visit: Payer: Self-pay

## 2023-08-17 DIAGNOSIS — Z853 Personal history of malignant neoplasm of breast: Secondary | ICD-10-CM | POA: Diagnosis not present

## 2023-08-17 DIAGNOSIS — M25551 Pain in right hip: Secondary | ICD-10-CM | POA: Diagnosis not present

## 2023-08-17 DIAGNOSIS — M47816 Spondylosis without myelopathy or radiculopathy, lumbar region: Secondary | ICD-10-CM | POA: Diagnosis not present

## 2023-08-17 MED ORDER — KETOROLAC TROMETHAMINE 15 MG/ML IJ SOLN
15.0000 mg | Freq: Once | INTRAMUSCULAR | Status: AC
  Administered 2023-08-17: 15 mg via INTRAMUSCULAR
  Filled 2023-08-17: qty 1

## 2023-08-17 MED ORDER — DEXAMETHASONE SODIUM PHOSPHATE 10 MG/ML IJ SOLN
10.0000 mg | Freq: Once | INTRAMUSCULAR | Status: AC
  Administered 2023-08-17: 10 mg via INTRAMUSCULAR
  Filled 2023-08-17: qty 1

## 2023-08-17 MED ORDER — KETOROLAC TROMETHAMINE 15 MG/ML IJ SOLN: 15.0000 mg | Freq: Once | INTRAMUSCULAR | Status: DC

## 2023-08-17 MED ORDER — PREDNISONE 10 MG PO TABS: ORAL_TABLET | ORAL | 0 refills | Status: AC

## 2023-08-17 NOTE — Discharge Instructions (Addendum)
You likely have inflammation of the muscle and nerve surrounding your hip that is causing your pain. You were given a steroid shot and an anti-inflammatory (Ketorolac) today for pain.  Your hip x-rays were not formally read by radiology.  The results will be in your MyChart portal.  You have been prescribed a present taper for at home.  Please take this as prescribed first thing morning with breakfast as it can keep you up at night.   You may take up to 1000mg  of tylenol every 6 hours as needed for pain.  Do not take more then 4g per day.  You may use up to 800mg  ibuprofen every 8 hours as needed for pain.  Do not exceed 2.4g of ibuprofen per day.  If your symptoms do not start to improve within the next week, I recommend make an appointment with physical therapy for further treatment.  Return the ER for any severe worsening of pain, weakness of your right leg, bowel or bladder control, any other new or concerning symptoms.

## 2023-08-17 NOTE — ED Triage Notes (Signed)
Severe right hip pain since she woke up yesterday am.  Pt took ibuprofen and tramadol without relief.  Not associated with any trauma

## 2023-08-17 NOTE — ED Provider Notes (Signed)
[place] [facility] Provider Note   CSN: [id number] Arrival date & time: 08/17/23  1049     History  Chief Complaint  Patient presents with   Hip Pain    Gina Jacobs is a 77 y.o. female with history of breast cancer, prediabetes, presents with concern for right hip pain that started yesterday.  Pain is on the outside of her right hip.  Denies any injuries to the areas or falls.  Denies any new numbness or tingling to the legs or any lower extremity weakness.  Pain worsens with any activity such as walking.  She has tried Tylenol, ibuprofen, and a heating pack on the area without relief.   Hip Pain       Home Medications Prior to Admission medications   Medication Sig Start Date End Date Taking? Authorizing Provider  predniSONE (DELTASONE) 10 MG tablet Take 4 tablets (40 mg total) by mouth daily with breakfast for 2 days, THEN 3 tablets (30 mg total) daily with breakfast for 2 days, THEN 2 tablets (20 mg total) daily with breakfast for 2 days, THEN 1 tablet (10 mg total) daily with breakfast for 1 day. 08/17/23 08/24/23 Yes Arabella Merles, PA-C  acetaminophen (TYLENOL) 500 MG tablet Take 500 mg by mouth as needed.    [provider]  Cholecalciferol (VITAMIN D3 PO) Take by mouth.    [provider]  Cyanocobalamin (VITAMIN B-12 PO) Take by mouth.    [provider]  escitalopram (LEXAPRO) 10 MG tablet Take 10 mg by mouth daily. 01/08/22   [provider]  IBUPROFEN PO Take by mouth as needed.    [provider]  metoprolol tartrate (LOPRESSOR) 100 MG tablet Take 2 hours prior to CT Patient not taking: Reported on 06/07/2022 01/12/22   Tobb, Lavona [place], DO  Probiotic TBEC 1 tablet    [provider]  traMADol (ULTRAM) 50 MG tablet Take by mouth every 6 (six) hours as needed.    [provider]  zolpidem (AMBIEN CR) 12.5 MG CR tablet Take 12.5 mg by mouth at bedtime as needed.  01/04/22   [provider]      Allergies    Patient has no known allergies.    Review of Systems   Review of Systems  Musculoskeletal:  Positive for arthralgias.    Physical Exam Updated Vital Signs BP 129/85 (BP Location: Left Arm)   Pulse 89   Temp 97.7 F (36.5 C) (Oral)   Resp 16   LMP  (LMP Unknown)   SpO2 95%  Physical Exam Vitals and nursing note reviewed.  Constitutional:      Appearance: Normal appearance.     Comments: Well-appearing, able to ambulate without difficulty but some pain in right hip  HENT:     Head: Atraumatic.  Cardiovascular:     Rate and Rhythm: Normal rate and regular rhythm.     Comments: 2+ dorsalis pedis pulse in the right lower extremity Pulmonary:     Effort: Pulmonary effort is normal.  Musculoskeletal:     Comments: No obvious deformity, erythema, edema of the right hip  No tenderness palpation over the trochanteric bursa, reports tenderness palpation just distal to the iliac crest and more in the gluteal region.  No pain with logroll, pain with internal and external rotation of the right hip  Neurological:     General: No focal deficit present.     Mental Status: She is alert.  Comments: 5/5 strength with resisted hip flexion bilaterally, plantarflexion and dorsiflexion bilaterally  Psychiatric:        Mood and Affect: Mood normal.        Behavior: Behavior normal.     ED Results / Procedures / Treatments   Labs (all labs ordered are listed, but only abnormal results are displayed) Labs Reviewed - No data to display  EKG None  Radiology DG Hip Unilat W or Wo Pelvis 2-3 Views Right Result Date: 08/17/2023 CLINICAL DATA:  Severe right hip pain on awakening yesterday. No known injury. EXAM: DG HIP (WITH OR WITHOUT PELVIS) 2-3V RIGHT COMPARISON:  None Available. FINDINGS: AP pelvis with AP and frog-leg lateral views of the right hip. The mineralization and alignment are normal. There is no evidence of acute fracture  or dislocation. No evidence of femoral head osteonecrosis. The hip joint spaces are preserved. Mild sacroiliac degenerative changes and lower lumbar spondylosis noted. The soft tissues appear unremarkable. IMPRESSION: No acute osseous findings or significant arthropathic changes. Mild sacroiliac degenerative changes and lower lumbar spondylosis. Electronically Signed   By: Carey Bullocks M.D.   On: 08/17/2023 14:42    Procedures Procedures    Medications Ordered in ED Medications  dexamethasone (DECADRON) injection 10 mg (10 mg Intramuscular Given 08/17/23 1315)  ketorolac (TORADOL) 15 MG/ML injection 15 mg (15 mg Intramuscular Given 08/17/23 1313)    ED Course/ Medical Decision Making/ A&P                                 Medical Decision Making Amount and/or Complexity of Data Reviewed Radiology: ordered.  Risk Prescription drug management.     Differential diagnosis includes but is not limited to musculoskeletal pain, radiculopathy, fracture, dislocation  ED Course:  Patient well-appearing, no acute distress.  Sudden onset right hip pain without any trauma or reported injuries to the area.  She is tender to palpation just distal to the iliac crest and along the gluteal region.  No tenderness to palpation of the greater trochanter, distal femur.  Pain reproduced with internal and external rotation of hip.  Neurovascular intact of the right lower extremity.  Able to ambulate without difficulty, but with some pain.  I independently reviewed her hip x-ray, I do not see any fractures, dislocations.  I suspect her pain is musculoskeletal.  Patient did not want to wait for the x-ray results.  I discussed with patient that I recommend waiting in case the read is different than my interpretation. I discussed the risks of leaving AMA with the patient including possible worsening of symptoms, disability, death.  Patient has decision-making capability and verbalized understanding of these risks  and wishes to leave.   Patient given IM Toradol and Decadron for pain 3:20 PM patient's hip x-ray has resulted.  The read shows no acute osseous findings, mild sacroiliac degenerative changes.  This does not change her management.  Impression: Right hip pain  Disposition:  The patient was discharged home with instructions to follow-up with PCP if symptoms not improved in the next week for PT referral.  Take prednisone taper as prescribed.  Tylenol ibuprofen as needed for pain. Return precautions given.   Imaging Studies ordered: I ordered imaging studies including right hip x-ray I independently visualized the imaging with scope of interpretation limited to determining acute life threatening conditions related to emergency care. Imaging showed no acute fracture dislocation I agree with the radiologist interpretation  External records from outside source obtained and reviewed including last BMP which showed patient's creatinine within normal limits              Final Clinical Impression(s) / ED Diagnoses Final diagnoses:  Right hip pain    Rx / DC Orders ED Discharge Orders          Ordered    predniSONE (DELTASONE) 10 MG tablet  Q breakfast        08/17/23 1430              Arabella Merles, PA-C 08/17/23 1531    Rondel Baton, MD 08/17/23 2000

## 2023-09-28 DIAGNOSIS — M1611 Unilateral primary osteoarthritis, right hip: Secondary | ICD-10-CM | POA: Diagnosis not present

## 2023-11-24 IMAGING — DX DG CHEST 2V
2 series · 2 of 2 positions shown · non-contrast
Comparison: 10/27/2007

CLINICAL DATA: Shortness of breath, chest pain

EXAM:
CHEST - 2 VIEW

[dg chest 2 view (1 of 2)]
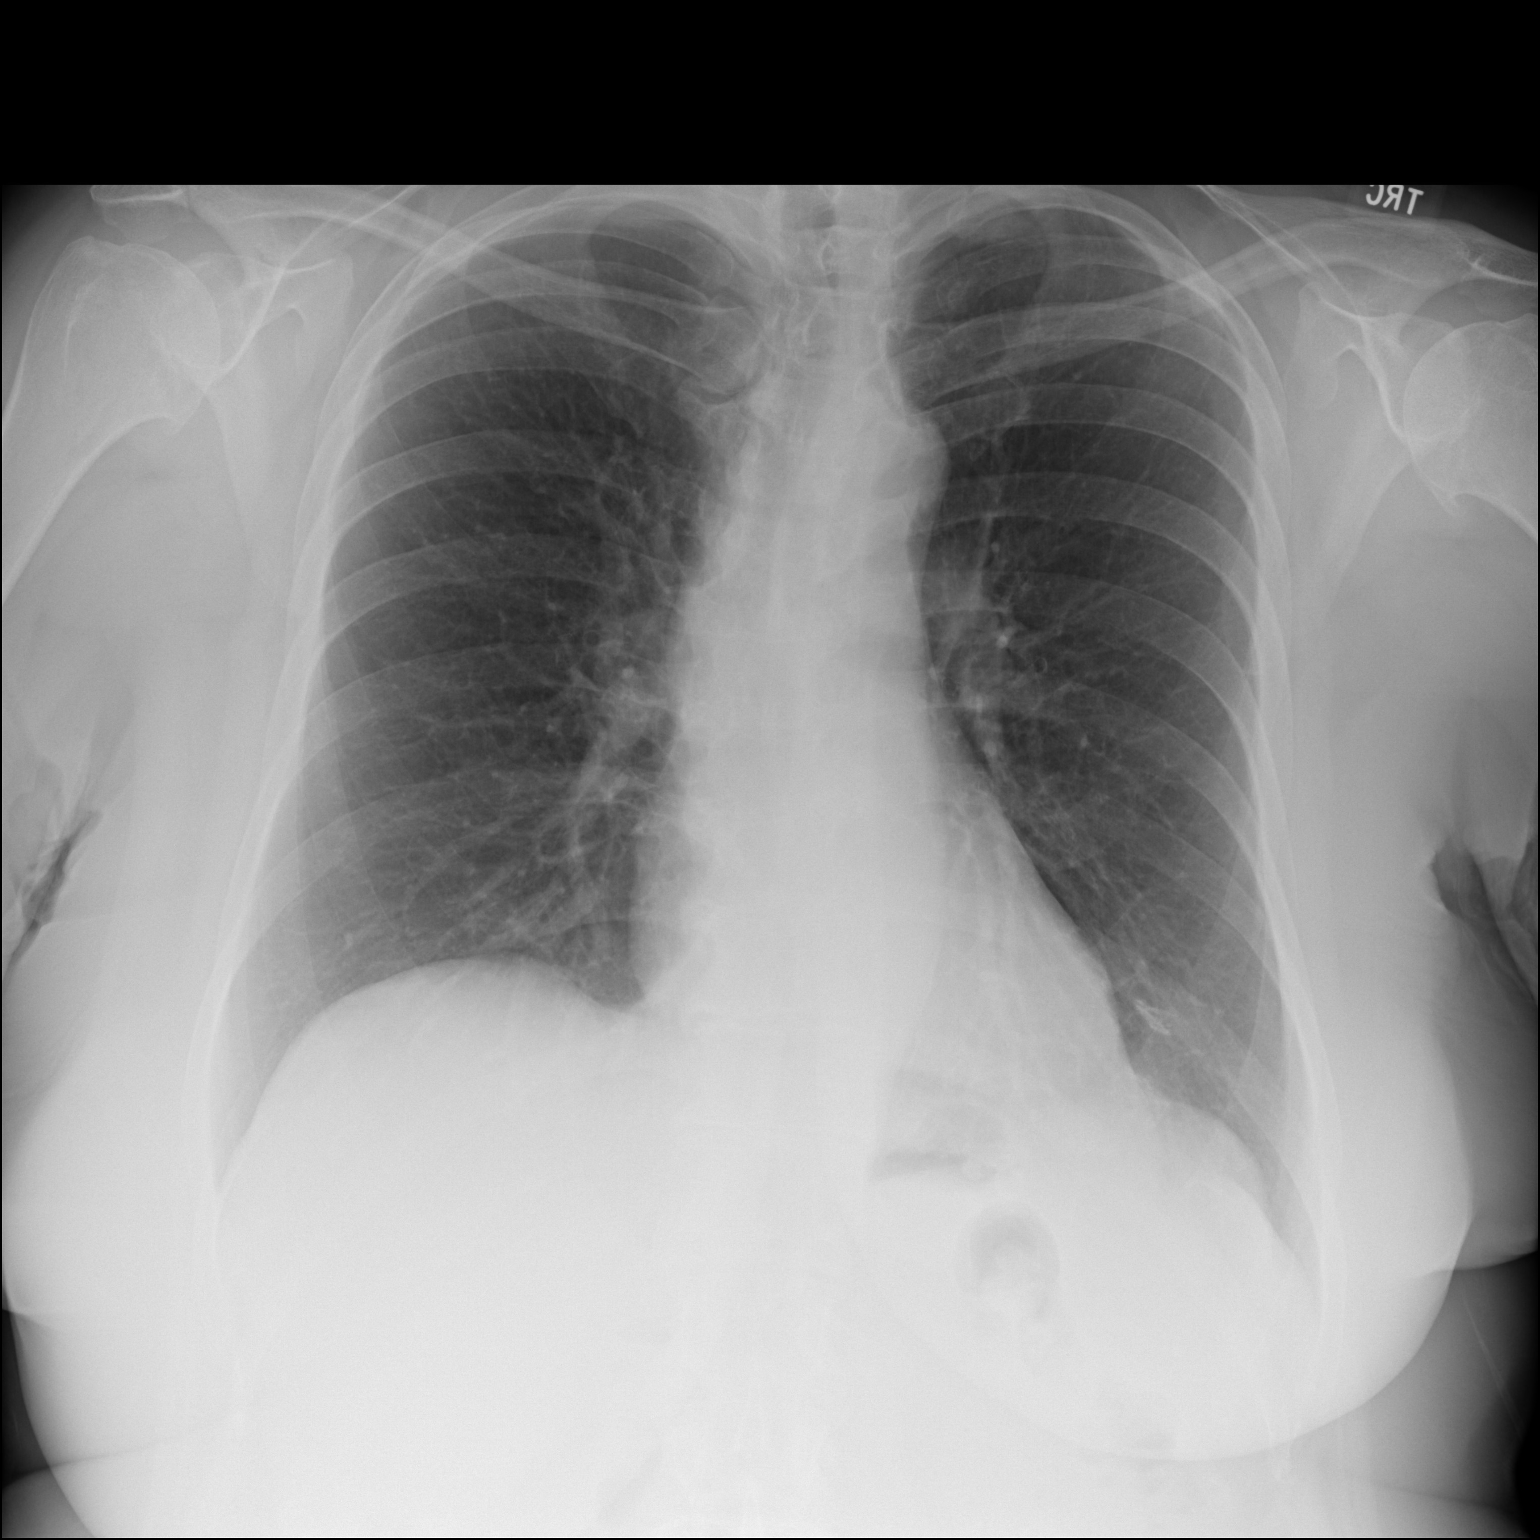

[dg chest 2 view (2 of 2)]
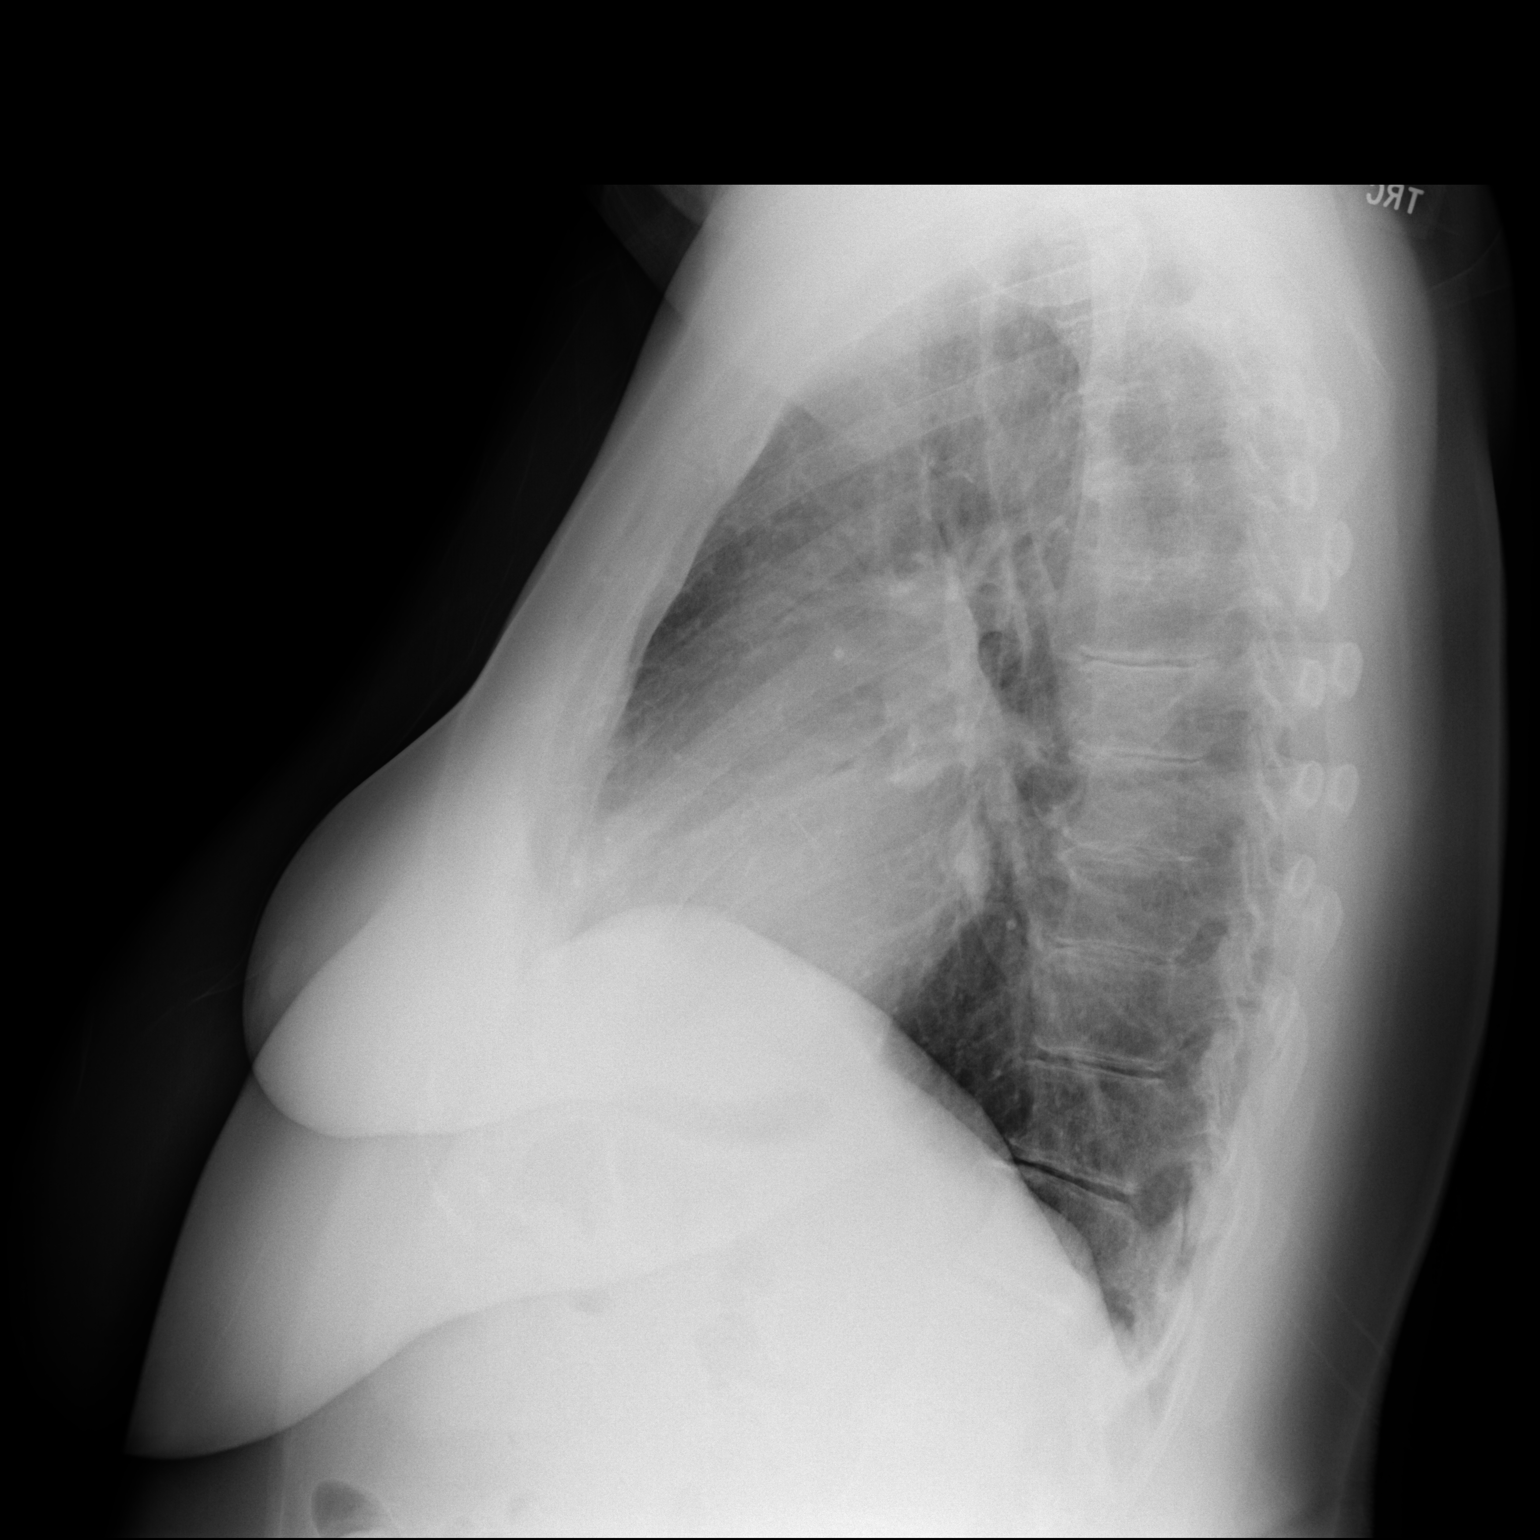

[2 of 2 positions shown; findings below may reference images not displayed]

FINDINGS: The heart size and mediastinal contours are within normal limits.
Both lungs are clear. The visualized skeletal structures are
unremarkable.
IMPRESSION: No active cardiopulmonary disease.
